# Patient Record
Sex: Male | Born: 1980 | Race: Black or African American | Hispanic: No | Marital: Single | Smoking: Former smoker
Health system: Southern US, Community
[De-identification: ages and names within clinical notes are randomized; demographics above are authoritative.]

## PROBLEM LIST (undated history)

## (undated) ENCOUNTER — Ambulatory Visit (HOSPITAL_COMMUNITY): Admission: EM | Payer: MEDICAID | Source: Home / Self Care

## (undated) ENCOUNTER — Ambulatory Visit (HOSPITAL_COMMUNITY): Disposition: A | Discharge status: Home / Self Care | Payer: BLUE CROSS/BLUE SHIELD

## (undated) DIAGNOSIS — K219 Gastro-esophageal reflux disease without esophagitis: Secondary | ICD-10-CM

## (undated) DIAGNOSIS — E119 Type 2 diabetes mellitus without complications: Secondary | ICD-10-CM

## (undated) HISTORY — PX: ANKLE ARTHROSCOPY WITH OPEN REDUCTION INTERNAL FIXATION (ORIF): SHX5582

## (undated) HISTORY — DX: Type 2 diabetes mellitus without complications: E11.9

---

## 2001-06-03 ENCOUNTER — Inpatient Hospital Stay (HOSPITAL_COMMUNITY): Admission: EM | Admit: 2001-06-03 | Discharge: 2001-06-07 | Payer: Self-pay | Admitting: *Deleted

## 2004-11-04 ENCOUNTER — Emergency Department (HOSPITAL_COMMUNITY): Admission: EM | Admit: 2004-11-04 | Discharge: 2004-11-04 | Payer: Self-pay | Admitting: Family Medicine

## 2005-01-20 ENCOUNTER — Emergency Department (HOSPITAL_COMMUNITY): Admission: EM | Admit: 2005-01-20 | Discharge: 2005-01-20 | Payer: Self-pay | Admitting: Family Medicine

## 2005-02-01 ENCOUNTER — Emergency Department (HOSPITAL_COMMUNITY): Admission: EM | Admit: 2005-02-01 | Discharge: 2005-02-01 | Payer: Self-pay | Admitting: Emergency Medicine

## 2005-03-23 ENCOUNTER — Emergency Department (HOSPITAL_COMMUNITY): Admission: EM | Admit: 2005-03-23 | Discharge: 2005-03-23 | Payer: Self-pay | Admitting: Family Medicine

## 2010-01-18 ENCOUNTER — Emergency Department (HOSPITAL_COMMUNITY): Admission: EM | Admit: 2010-01-18 | Discharge: 2010-01-18 | Payer: Self-pay | Admitting: Family Medicine

## 2011-01-26 LAB — POCT I-STAT, CHEM 8
Hemoglobin: 17.7 g/dL — ABNORMAL HIGH (ref 13.0–17.0)
Sodium: 138 mEq/L (ref 135–145)
TCO2: 27 mmol/L (ref 0–100)

## 2017-06-22 ENCOUNTER — Encounter: Payer: Self-pay | Admitting: Pediatric Intensive Care

## 2017-07-13 NOTE — Congregational Nurse Program (Signed)
Congregational Nurse Program Note  Date of Encounter: 06/22/2017  Past Medical History: No past medical history on file.  Encounter Details:     CNP Questionnaire - 06/22/17 1121      Patient Demographics   Is this a new or existing patient? New   Patient is considered a/an Not Applicable   Race African-American/Black     Patient Assistance   Location of Patient Assistance Not Applicable   Patient's financial/insurance status Low Income;Self-Pay (Uninsured)   Uninsured Patient ([employer], trade unionCard/Care Connects) No   Patient referred to apply for the following financial assistance Not Applicable   Food insecurities addressed Not Applicable   Transportation assistance Yes   Type of Assistance Bus Pass Given   Assistance securing medications No   Educational health offerings Acute disease;Navigating the healthcare system     Encounter Details   Primary purpose of visit Acute Illness/Condition Visit;Navigating the Healthcare System   Was an Emergency Department visit averted? Not Applicable   Does patient have a medical provider? No   Patient referred to Clinic   Was a mental health screening completed? (GAINS tool) No   Does patient have dental issues? No   Does patient have vision issues? No   Does your patient have an abnormal blood pressure today? No   Since previous encounter, have you referred patient for abnormal blood pressure that resulted in a new diagnosis or medication change? No   Does your patient have an abnormal blood glucose today? No   Since previous encounter, have you referred patient for abnormal blood glucose that resulted in a new diagnosis or medication change? No   Was there a life-saving intervention made? No      Recently had surgery on his right ankle in July.  Not wearing a brace or a boot.  Asked what "would happen if I did not wear the boot?"  Discussed with the healing process and the importance for him to wear the boot.  Does not have a health care  provider.  [facility]  Bus passes given

## 2017-07-13 NOTE — Congregational Nurse Program (Signed)
Congregational Nurse Program Note  Date of Encounter: 06/29/2017  Past Medical History: No past medical history on file.  Encounter Details:     CNP Questionnaire - 06/29/17 1125      Patient Demographics   Is this a new or existing patient? Existing   Patient is considered a/an Not Applicable   Race African-American/Black     Patient Assistance   Location of Patient Assistance Not Applicable   Patient's financial/insurance status Low Income;Self-Pay (Uninsured)   Uninsured Patient ([employer], trade unionCard/Care Connects) No   Patient referred to apply for the following financial assistance Not Applicable   Food insecurities addressed Not Applicable   Transportation assistance No   Assistance securing medications No   Educational health offerings Acute disease;Navigating the healthcare system     Encounter Details   Primary purpose of visit Acute Illness/Condition Visit;Navigating the Healthcare System   Was an Emergency Department visit averted? Not Applicable   Does patient have a medical provider? No   Patient referred to Clinic   Was a mental health screening completed? (GAINS tool) No   Does patient have dental issues? No   Does patient have vision issues? No   Does your patient have an abnormal blood pressure today? No   Since previous encounter, have you referred patient for abnormal blood pressure that resulted in a new diagnosis or medication change? No   Does your patient have an abnormal blood glucose today? No   Since previous encounter, have you referred patient for abnormal blood glucose that resulted in a new diagnosis or medication change? No   Was there a life-saving intervention made? No     Client still not wearing the boot for his right ankle recovery.  He asked the question again, "What will happen if I do not wear the boot"?  Explained to him again the risk involved in following through with the prescribed treatment plan and the importance of wearing the boot and  not bearing weight on that foot.  Client [facility].  Encouraged client to do so

## 2017-07-19 NOTE — Congregational Nurse Program (Signed)
Congregational Nurse Program Note  Date of Encounter: 06/22/2017  Past Medical History: No past medical history on file.  Encounter Details:     CNP Questionnaire - 06/29/17 1125      Patient Demographics   Is this a new or existing patient? Existing   Patient is considered a/an Not Applicable   Race African-American/Black     Patient Assistance   Location of Patient Assistance Not Applicable   Patient's financial/insurance status Low Income;Self-Pay (Uninsured)   Uninsured Patient ([employer], trade union) No   Patient referred to apply for the following financial assistance Not Applicable   Food insecurities addressed Not Applicable   Transportation assistance No   Assistance securing medications No   Educational health offerings Acute disease;Navigating the healthcare system     Encounter Details   Primary purpose of visit Acute Illness/Condition Visit;Navigating the Healthcare System   Was an Emergency Department visit averted? Not Applicable   Does patient have a medical provider? No   Patient referred to Clinic   Was a mental health screening completed? (GAINS tool) No   Does patient have dental issues? No   Does patient have vision issues? No   Does your patient have an abnormal blood pressure today? No   Since previous encounter, have you referred patient for abnormal blood pressure that resulted in a new diagnosis or medication change? No   Does your patient have an abnormal blood glucose today? No   Since previous encounter, have you referred patient for abnormal blood glucose that resulted in a new diagnosis or medication change? No   Was there a life-saving intervention made? No     Client fractured left ankle and had surgical repair at [facility]. Client is supposed to be non-weight bearing (per discharge instructions) and wearing a boot but client is weight bearing and not wearing boot. CN advised client that this will lengthen healing process, possible cause  increased pain. CN advised follow up at [facility].

## 2017-10-24 ENCOUNTER — Ambulatory Visit (HOSPITAL_COMMUNITY)
Admission: EM | Admit: 2017-10-24 | Discharge: 2017-10-24 | Disposition: A | Discharge status: Home / Self Care | Payer: Self-pay | Attending: Family Medicine | Admitting: Family Medicine

## 2017-10-24 ENCOUNTER — Encounter (HOSPITAL_COMMUNITY): Payer: Self-pay | Admitting: *Deleted

## 2017-10-24 ENCOUNTER — Other Ambulatory Visit: Payer: Self-pay

## 2017-10-24 DIAGNOSIS — J22 Unspecified acute lower respiratory infection: Secondary | ICD-10-CM

## 2017-10-24 MED ORDER — AZITHROMYCIN 250 MG PO TABS: ORAL_TABLET | ORAL | 0 refills | Status: DC

## 2017-10-24 NOTE — Discharge Instructions (Signed)
Push fluids to ensure adequate hydration and keep secretions thin.  Continue with robitussin/mucinex for congestion. Tylenol and/or ibuprofen as needed for pain or fevers.  Complete course of antibiotics. If symptoms worsen or do not improve in the next week to return to be seen or to follow up with PCP.

## 2017-10-24 NOTE — ED Triage Notes (Signed)
C/O cough x 3 months with body aches.  C/O fatigue x 1 month.  Unk if fevers.  Has taken several OTC meds for sxs without any relief.

## 2017-10-24 NOTE — ED Provider Notes (Signed)
[facility]    CSN: [id number] Arrival date & time: 10/24/17  1754     History   Chief Complaint Chief Complaint  Patient presents with  . Cough  . Generalized Body Aches    HPI Jeffery Santos is a 36 y.o. male.   Jeffery Santos presents with complaints of cough and congestion which has been ongoing for the past three months but has worsened over the past few days. Mucus comes up with cough causing gagging. Runny nose worse today. Without ear pain or known fevers. Denies abdominal pain, nausea or vomiting. Without recent travel out of the county. Without asthma history. Does smoke approximately 0.5ppd. Has been taking nyquil, robitussin and mucinex for his symptoms which minimally help. Abdomen muscles are sore from coughing and now throat is painful due to cough. No specific known ill contacts. Fatigued.   ROS per HPI.       History reviewed. No pertinent past medical history.  There are no active problems to display for this patient.   Past Surgical History:  Procedure Laterality Date  . ANKLE ARTHROSCOPY WITH OPEN REDUCTION INTERNAL FIXATION (ORIF)         Home Medications    Prior to Admission medications   Medication Sig Start Date End Date Taking? Authorizing Provider  azithromycin (ZITHROMAX) 250 MG tablet Take first 2 tablets together, then 1 every day until finished. 10/24/17   Georgetta HaberBurky, Natalie B, NP    Family History No family history on file.  Social History Social History   Tobacco Use  . Smoking status: Current Every Day Smoker    Types: Cigarettes  Substance Use Topics  . Alcohol use: Yes    Comment: occasionally  . Drug use: No     Allergies   Patient has no known allergies.   Review of Systems Review of Systems   Physical Exam Triage Vital Signs ED Triage Vitals  Enc Vitals Group     BP 10/24/17 1802 136/84     Pulse Rate 10/24/17 1802 93     Resp 10/24/17 1802 (!) 22     Temp 10/24/17 1802 99 F (37.2 C)     Temp  Source 10/24/17 1802 Oral     SpO2 10/24/17 1802 97 %     Weight --      Height --      Head Circumference --      Peak Flow --      Pain Score 10/24/17 1804 2     Pain Loc --      Pain Edu? --      Excl. in GC? --    No data found.  Updated Vital Signs BP 136/84   Pulse 93   Temp 99 F (37.2 C) (Oral)   Resp (!) 22   SpO2 97%   Visual Acuity Right Eye Distance:   Left Eye Distance:   Bilateral Distance:    Right Eye Near:   Left Eye Near:    Bilateral Near:     Physical Exam  Constitutional: He is oriented to person, place, and time. He appears well-developed and well-nourished.  HENT:  Head: Normocephalic and atraumatic.  Right Ear: Tympanic membrane, external ear and ear canal normal.  Left Ear: Tympanic membrane, external ear and ear canal normal.  Nose: Rhinorrhea present. Right sinus exhibits no maxillary sinus tenderness and no frontal sinus tenderness. Left sinus exhibits no maxillary sinus tenderness and no frontal sinus tenderness.  Mouth/Throat: Uvula is midline, oropharynx  is clear and moist and mucous membranes are normal.  Eyes: Conjunctivae are normal. Pupils are equal, round, and reactive to light.  Neck: Normal range of motion.  Cardiovascular: Normal rate and regular rhythm.  Pulmonary/Chest: Effort normal and breath sounds normal.  Significant frequent congested cough noted  Lymphadenopathy:    He has no cervical adenopathy.  Neurological: He is alert and oriented to person, place, and time.  Skin: Skin is warm and dry.  Vitals reviewed.    UC Treatments / Results  Labs (all labs ordered are listed, but only abnormal results are displayed) Labs Reviewed - No data to display  EKG  EKG Interpretation None       Radiology No results found.  Procedures Procedures (including critical care time)  Medications Ordered in UC Medications - No data to display   Initial Impression / Assessment and Plan / UC Course  I have reviewed the  triage vital signs and the nursing notes.  Pertinent labs & imaging results that were available during my care of the patient were reviewed by me and considered in my medical decision making (see chart for details).     Complete course of antibiotics. Without tachycardia or hypoxia, temp 99 today, deferred xray at this time. Obvious significant mucus production present. Without significant risk factors of TB, discussed return or follow up with PCP if symptoms do not improve with antibiotics or if worsening of symptoms. Continue with OTC medications as needed for symptoms. Patient verbalized understanding and agreeable to plan.    Final Clinical Impressions(s) / UC Diagnoses   Final diagnoses:  Lower respiratory tract infection    ED Discharge Orders        Ordered    azithromycin (ZITHROMAX) 250 MG tablet     10/24/17 1817       Controlled Substance Prescriptions [place] Controlled Substance Registry consulted? Not Applicable   Georgetta HaberBurky, Natalie B, NP 10/24/17 [phone]

## 2017-11-23 ENCOUNTER — Emergency Department (HOSPITAL_COMMUNITY)
Admission: EM | Admit: 2017-11-23 | Discharge: 2017-11-24 | Disposition: A | Discharge status: Home / Self Care | Payer: Self-pay | Attending: Emergency Medicine | Admitting: Emergency Medicine

## 2017-11-23 ENCOUNTER — Other Ambulatory Visit: Payer: Self-pay

## 2017-11-23 ENCOUNTER — Encounter (HOSPITAL_COMMUNITY): Payer: Self-pay | Admitting: Emergency Medicine

## 2017-11-23 DIAGNOSIS — M25572 Pain in left ankle and joints of left foot: Secondary | ICD-10-CM | POA: Insufficient documentation

## 2017-11-23 DIAGNOSIS — G8929 Other chronic pain: Secondary | ICD-10-CM | POA: Insufficient documentation

## 2017-11-23 DIAGNOSIS — F17210 Nicotine dependence, cigarettes, uncomplicated: Secondary | ICD-10-CM | POA: Insufficient documentation

## 2017-11-23 NOTE — ED Notes (Signed)
ED Provider at bedside. 

## 2017-11-23 NOTE — ED Triage Notes (Addendum)
Patient reports worsening chronic left ankle pain for several weeks , denies recent injury or fall , pt. stated he injured it last year ( October) [facility] Ambulatory with no swelling or deformity .

## 2017-11-23 NOTE — ED Provider Notes (Signed)
[facility] Provider Note   CSN: [id number] Arrival date & time: 11/23/17  1854     History   Chief Complaint Chief Complaint  Patient presents with  . Ankle Pain    Chronic     HPI Jeffery Santos is a 37 y.o. male.  HPI   Jeffery Santos is a 37 year old male with a history of ORIF of left ankle 08/2017 who presents emergency department for persistent left ankle pain over the past month and a half.  Patient states that he broke his left ankle while in prison and [facility].  Reports that he was improving with physical therapy and pain medication after the surgery, but when he was released from prison states that he is no longer received care.  States that he is frustrated because for the last month and a half he has had persistent left ankle pain.  Reports that the pain is generalized over the entire foot, no specific point tenderness.  Denies recent injury to the foot.  Reports 10/10 severity sharp ankle pain.  He has tried taking ibuprofen without significant relief of his symptoms.  Pain is worsened when he ambulates or moves the ankle in any way.  Patient denies fever, chills, redness, swelling, numbness, weakness.  Is able to ambulate independently.  History reviewed. No pertinent past medical history.  There are no active problems to display for this patient.   Past Surgical History:  Procedure Laterality Date  . ANKLE ARTHROSCOPY WITH OPEN REDUCTION INTERNAL FIXATION (ORIF)         Home Medications    Prior to Admission medications   Medication Sig Start Date End Date Taking? Authorizing Provider  azithromycin (ZITHROMAX) 250 MG tablet Take first 2 tablets together, then 1 every day until finished. 10/24/17   Georgetta HaberBurky, Natalie B, NP    Family History No family history on file.  Social History Social History   Tobacco Use  . Smoking status: Current Every Day Smoker    Types: Cigarettes    Substance Use Topics  . Alcohol use: Yes    Comment: occasionally  . Drug use: No     Allergies   Patient has no known allergies.   Review of Systems Review of Systems  Constitutional: Negative for chills, fatigue and fever.  Musculoskeletal: Positive for arthralgias (left ankle). Negative for gait problem and joint swelling.  Skin: Negative for color change and wound.  Neurological: Negative for weakness and numbness.     Physical Exam Updated Vital Signs BP 119/82 (BP Location: Right Arm)   Pulse 99   Temp 99.8 F (37.7 C) (Oral)   Resp 20   SpO2 99%   Physical Exam  Constitutional: He is oriented to person, place, and time. He appears well-developed and well-nourished. No distress.  HENT:  Head: Normocephalic and atraumatic.  Eyes: Right eye exhibits no discharge. Left eye exhibits no discharge.  Pulmonary/Chest: Effort normal. No respiratory distress.  Musculoskeletal:  Left ankle with tenderness to palpation grossly over lateral and medial malleolus and dorsum of the foot. No swelling.  Full active ROM, although painful. No erythema, ecchymosis, or deformity appreciated. No break in skin. No pain to fifth metatarsal area or navicular region. Achilles intact. Good pedal pulse and cap refill of toes.   Neurological: He is alert and oriented to person, place, and time. Coordination normal.  Distal sensation to light/sharp touch intact in bilateral lower extremities.  Gait normal  and coordination and balance, although painful.  Skin: Skin is warm and dry. Capillary refill takes less than 2 seconds. No rash noted. He is not diaphoretic. No erythema.  Psychiatric: He has a normal mood and affect. His behavior is normal.  Nursing note and vitals reviewed.    ED Treatments / Results  Labs (all labs ordered are listed, but only abnormal results are displayed) Labs Reviewed - No data to display  EKG  EKG Interpretation None       Radiology No results  found.  Procedures Procedures (including critical care time)  Medications Ordered in ED Medications - No data to display   Initial Impression / Assessment and Plan / ED Course  I have reviewed the triage vital signs and the nursing notes.  Pertinent labs & imaging results that were available during my care of the patient were reviewed by me and considered in my medical decision making (see chart for details).    Left foot neurovascularly intact. No erythema, edema or warmth to suggest infection. He has full active ROM. Presentation non-concerning for septic arthritis. He is able to ambulate independently. Patient refuses an Xray to evaluate for acute fracture or abnormality. Discussed with patient that we do not manage chronic pain from the Emergency Department. Have counseled him to follow up with his orthopedist for further evaluation and pain management concerns. Patient also given information to follow up with pain management. He agrees and voices understanding to the above plan.   Final Clinical Impressions(s) / ED Diagnoses   Final diagnoses:  Chronic pain of left ankle    ED Discharge Orders    None       Lawrence Marseilles 11/24/17 1027    Bethann Berkshire, MD 11/24/17 1620

## 2017-11-23 NOTE — ED Notes (Signed)
Pts name called for a room no answer 

## 2017-11-24 NOTE — Discharge Instructions (Signed)
We do not manage chronic pain from the emergency department.  Please take ibuprofen and Tylenol as needed.  I have listed the information below to pain management.  Please call to schedule an appointment to manage her ongoing pain.  Return to the emergency department if you have any new or worsening symptoms.

## 2018-02-04 ENCOUNTER — Ambulatory Visit (HOSPITAL_COMMUNITY): Admission: EM | Admit: 2018-02-04 | Discharge: 2018-02-04 | Discharge status: Against Medical Advice | Payer: Self-pay

## 2018-02-04 NOTE — ED Notes (Signed)
Per pt access, pt Left before registering.

## 2018-12-07 ENCOUNTER — Other Ambulatory Visit: Payer: Self-pay

## 2018-12-07 ENCOUNTER — Encounter (HOSPITAL_COMMUNITY): Payer: Self-pay | Admitting: Emergency Medicine

## 2018-12-07 ENCOUNTER — Ambulatory Visit (HOSPITAL_COMMUNITY)
Admission: EM | Admit: 2018-12-07 | Discharge: 2018-12-07 | Disposition: A | Discharge status: Home / Self Care | Payer: Self-pay | Attending: Family Medicine | Admitting: Family Medicine

## 2018-12-07 DIAGNOSIS — N3001 Acute cystitis with hematuria: Secondary | ICD-10-CM

## 2018-12-07 LAB — POCT URINALYSIS DIP (DEVICE)
BILIRUBIN URINE: NEGATIVE
Glucose, UA: NEGATIVE mg/dL
Ketones, ur: NEGATIVE mg/dL
NITRITE: NEGATIVE
Protein, ur: NEGATIVE mg/dL
Specific Gravity, Urine: >=1.03 (ref 1.005–1.030)
Urobilinogen, UA: 0.2 mg/dL (ref 0.0–1.0)
pH: 5.5 (ref 5.0–8.0)

## 2018-12-07 MED ORDER — CEPHALEXIN 500 MG PO CAPS: 500.0000 mg | ORAL_CAPSULE | Freq: Three times a day (TID) | ORAL | 0 refills | Status: DC

## 2018-12-07 NOTE — ED Provider Notes (Signed)
[facility]    CSN: [id number] Arrival date & time: 12/07/18  [id number]     History   Chief Complaint Chief Complaint  Patient presents with  . SEXUALLY TRANSMITTED DISEASE    HPI Arna SnipeByron L Ruvalcaba is a 38 y.o. male.   HPI  Patient is here for a possible urinary tract infection.  He does not think he has been exposed to any STD, he is in a long standing relationship.  He has never had a bladder, prostate, or kidney infection.  He has no abdominal pain.  He has no fever chills.  He does have dysuria.  No discharge.  No rash.  He states he also has some pain with an erection at the base of his penis.  No problems with bowels.  Symptoms have been present for 2 or 3 days. He has a lot of questions about how men get urinary tract infections.  History reviewed. No pertinent past medical history.  There are no active problems to display for this patient.   Past Surgical History:  Procedure Laterality Date  . ANKLE ARTHROSCOPY WITH OPEN REDUCTION INTERNAL FIXATION (ORIF)         Home Medications    Prior to Admission medications   Medication Sig Start Date End Date Taking? Authorizing Provider  cephALEXin (KEFLEX) 500 MG capsule Take 1 capsule (500 mg total) by mouth 3 (three) times daily. 12/07/18   Eustace MooreNelson, Stellan Vick Sue, MD    Family History Family History  Problem Relation Age of Onset  . Healthy Mother   . Healthy Father     Social History Social History   Tobacco Use  . Smoking status: Current Every Day Smoker    Types: Cigarettes  Substance Use Topics  . Alcohol use: Yes    Comment: occasionally  . Drug use: No     Allergies   Patient has no known allergies.   Review of Systems Review of Systems  Constitutional: Negative for chills and fever.  HENT: Negative for ear pain and sore throat.   Eyes: Negative for pain and visual disturbance.  Respiratory: Negative for cough and shortness of breath.   Cardiovascular: Negative for chest pain and  palpitations.  Gastrointestinal: Negative for abdominal pain and vomiting.  Genitourinary: Positive for dysuria and penile pain. Negative for hematuria.  Musculoskeletal: Negative for arthralgias and back pain.  Skin: Negative for color change and rash.  Neurological: Negative for seizures and syncope.  All other systems reviewed and are negative.    Physical Exam Triage Vital Signs ED Triage Vitals [12/07/18 0956]  Enc Vitals Group     BP      Pulse      Resp      Temp      Temp src      SpO2      Weight      Height      Head Circumference      Peak Flow      Pain Score 3     Pain Loc      Pain Edu?      Excl. in GC?    No data found.  Updated Vital Signs There were no vitals taken for this visit.  Visual Acuity Right Eye Distance:   Left Eye Distance:   Bilateral Distance:    Right Eye Near:   Left Eye Near:    Bilateral Near:     Physical Exam Constitutional:      General: He  is not in acute distress.    Appearance: He is well-developed.  HENT:     Head: Normocephalic and atraumatic.  Eyes:     Conjunctiva/sclera: Conjunctivae normal.     Pupils: Pupils are equal, round, and reactive to light.  Neck:     Musculoskeletal: Normal range of motion.  Cardiovascular:     Rate and Rhythm: Normal rate.  Pulmonary:     Effort: Pulmonary effort is normal. No respiratory distress.  Abdominal:     General: There is no distension.     Palpations: Abdomen is soft.  Genitourinary:    Comments: No current symptoms Musculoskeletal: Normal range of motion.  Skin:    General: Skin is warm and dry.  Neurological:     Mental Status: He is alert.      UC Treatments / Results  Labs (all labs ordered are listed, but only abnormal results are displayed) Labs Reviewed  POCT URINALYSIS DIP (DEVICE) - Abnormal; Notable for the following components:      Result Value   Hgb urine dipstick TRACE (*)    Leukocytes, UA SMALL (*)    All other components within normal  limits  RPR  HIV ANTIBODY (ROUTINE TESTING W REFLEX)  URINE CYTOLOGY ANCILLARY ONLY    EKG None  Radiology No results found.  Procedures Procedures (including critical care time)  Medications Ordered in UC Medications - No data to display  Initial Impression / Assessment and Plan / UC Course  I have reviewed the triage vital signs and the nursing notes.  Pertinent labs & imaging results that were available during my care of the patient were reviewed by me and considered in my medical decision making (see chart for details).     Discussed the basics of the male urinary tract, kidneys, ureters, bladder, urethra, and prostate.  Infection can be present in any of these structures.  Infection can be from a bacterial cause, STD or a common bacteria.  No indication at this time of infidelity or STD. Final Clinical Impressions(s) / UC Diagnoses   Final diagnoses:  Acute cystitis with hematuria     Discharge Instructions     Take the antibiotic as prescribed Drink more water We did lab testing during this visit.  If there are any abnormal findings that require change in medicine or indicate a positive result, you will be notified.  If all of your tests are normal, you will not be called.      ED Prescriptions    Medication Sig Dispense Auth. Provider   cephALEXin (KEFLEX) 500 MG capsule Take 1 capsule (500 mg total) by mouth 3 (three) times daily. 15 capsule Eustace Moore, MD     Controlled Substance Prescriptions [place] Controlled Substance Registry consulted? Not Applicable   Eustace Moore, MD 12/07/18 1345

## 2018-12-07 NOTE — ED Triage Notes (Signed)
Soreness with erect penis, noticed over 2-3 days.   No pain with urination.  Slight uncomfortable feeling with urination No penile discharge

## 2018-12-07 NOTE — Discharge Instructions (Addendum)
Take the antibiotic as prescribed Drink more water We did lab testing during this visit.  If there are any abnormal findings that require change in medicine or indicate a positive result, you will be notified.  If all of your tests are normal, you will not be called.

## 2018-12-08 LAB — URINE CYTOLOGY ANCILLARY ONLY
Chlamydia: POSITIVE — AB
NEISSERIA GONORRHEA: NEGATIVE
TRICH (WINDOWPATH): NEGATIVE

## 2018-12-08 LAB — HIV ANTIBODY (ROUTINE TESTING W REFLEX): HIV Screen 4th Generation wRfx: NONREACTIVE

## 2018-12-08 LAB — RPR: RPR Ser Ql: NONREACTIVE

## 2018-12-09 ENCOUNTER — Telehealth (HOSPITAL_COMMUNITY): Payer: Self-pay | Admitting: Emergency Medicine

## 2018-12-09 MED ORDER — AZITHROMYCIN 250 MG PO TABS: 1000.0000 mg | ORAL_TABLET | Freq: Once | ORAL | 0 refills | Status: AC

## 2018-12-09 NOTE — Telephone Encounter (Signed)
Chlamydia is positive.  Rx po zithromax 1g #1 dose no refills was sent to the pharmacy of record.  Pt needs education to please refrain from sexual intercourse for 7 days to give the medicine time to work, sexual partners need to be notified and tested/treated.  Condoms may reduce risk of reinfection.  Recheck or followup with PCP for further evaluation if symptoms are not improving.   [facility] notified.  Number not in service. No other number. Letter sent.

## 2019-01-06 ENCOUNTER — Telehealth (HOSPITAL_COMMUNITY): Payer: Self-pay | Admitting: Internal Medicine

## 2019-01-06 MED ORDER — AZITHROMYCIN 250 MG PO TABS: 1000.0000 mg | ORAL_TABLET | Freq: Every day | ORAL | 0 refills | Status: AC

## 2019-01-06 NOTE — Telephone Encounter (Signed)
PT made aware of positive chlamydia result. He requests azithromycin be sent to [facility]. PT given instructions and instructed to refrain from sexual intercourse for 7 days.

## 2019-05-10 ENCOUNTER — Encounter (HOSPITAL_COMMUNITY): Payer: Self-pay

## 2019-05-10 ENCOUNTER — Other Ambulatory Visit: Payer: Self-pay

## 2019-05-10 ENCOUNTER — Ambulatory Visit (HOSPITAL_COMMUNITY)
Admission: EM | Admit: 2019-05-10 | Discharge: 2019-05-10 | Disposition: A | Discharge status: Home / Self Care | Payer: Self-pay | Attending: Emergency Medicine | Admitting: Emergency Medicine

## 2019-05-10 ENCOUNTER — Ambulatory Visit (INDEPENDENT_AMBULATORY_CARE_PROVIDER_SITE_OTHER): Payer: Self-pay

## 2019-05-10 DIAGNOSIS — M25572 Pain in left ankle and joints of left foot: Secondary | ICD-10-CM

## 2019-05-10 DIAGNOSIS — G8929 Other chronic pain: Secondary | ICD-10-CM

## 2019-05-10 MED ORDER — NAPROXEN 500 MG PO TABS: 500.0000 mg | ORAL_TABLET | Freq: Two times a day (BID) | ORAL | 0 refills | Status: DC

## 2019-05-10 MED ORDER — HYDROCODONE-ACETAMINOPHEN 5-325 MG PO TABS: 1.0000 | ORAL_TABLET | Freq: Four times a day (QID) | ORAL | 0 refills | Status: DC | PRN

## 2019-05-10 NOTE — ED Provider Notes (Signed)
HPI  SUBJECTIVE:  Jeffery Santos is a 38 y.o. male who presents with left anterior lateral ankle pain described as throbbing, sharp since injuring it and having surgery on it 22 months ago.  He states that there are some days where he is pain-free.  States that the pain has not changed since it started but that it is severe.  No fever, erythema, swelling.  Reports some numbness in between his first and second toe, but this has been present since the surgery.  No limitation of motion.  No increased temperature.  No repeat injury, change in his physical activity.  He has tried Tylenol, ibuprofen, ice, elevation without improvement of symptoms.  No alleviating factors.  Symptoms are worse with weightbearing.  He has a past medical history of left ankle injury status post surgery at [facility].  History reviewed. No pertinent past medical history.  Past Surgical History:  Procedure Laterality Date  . ANKLE ARTHROSCOPY WITH OPEN REDUCTION INTERNAL FIXATION (ORIF)      Family History  Problem Relation Age of Onset  . Healthy Mother   . Healthy Father     Social History   Tobacco Use  . Smoking status: Current Every Day Smoker    Types: Cigarettes  Substance Use Topics  . Alcohol use: Yes    Comment: occasionally  . Drug use: No    No current facility-administered medications for this encounter.   Current Outpatient Medications:  .  HYDROcodone-acetaminophen (NORCO/VICODIN) 5-325 MG tablet, Take 1-2 tablets by mouth every 6 (six) hours as needed for moderate pain or severe pain., Disp: 12 tablet, Rfl: 0 .  naproxen (NAPROSYN) 500 MG tablet, Take 1 tablet (500 mg total) by mouth 2 (two) times daily., Disp: 20 tablet, Rfl: 0  No Known Allergies   ROS  As noted in HPI.   Physical Exam  BP (!) 134/91 (BP Location: Right Arm)   Pulse 86   Temp 98.6 F (37 C) (Oral)   Resp 18   Wt 122.5 kg   SpO2 98%   Constitutional: Well developed, well nourished, no acute distress Eyes:  EOMI,  conjunctiva normal bilaterally HENT: Normocephalic, atraumatic,mucus membranes moist Respiratory: Normal inspiratory effort Cardiovascular: Normal rate GI: nondistended skin: No rash, skin intact Musculoskeletal:  + 2 surgical scars anterior ankle, lateral scar is tender.  L Ankle Tenderness,  Distal fibula  tender, Medial malleolus NT ,  Deltoid ligament medially NT , ATFL laterally  tender, posterior tablofibular ligament NT, calcaneofibular ligament laterally NT,  Achilles NT, calcaneus NT,  Proximal 5th metatarsal NT, Midfoot NT, distal NVI with baseline sensation / motor to foot grossly intact.  Pain with dorsiflexion/plantarflexion.  no pain with inversion/eversion.  - bruising.  No erythema, swelling.  No increased temperature.  Neurologic: Alert & oriented x 3, no focal neuro deficits Psychiatric: Speech and behavior appropriate   ED Course   Medications - No data to display  Orders Placed This Encounter  Procedures  . DG Ankle Complete Left    Standing Status:   Standing    Number of Occurrences:   1    Order Specific Question:   Reason for Exam (SYMPTOM  OR DIAGNOSIS REQUIRED)    Answer:   s/p orif r/o acute changes    No results found for this or any previous visit (from the past 24 hour(s)). Dg Ankle Complete Left  Result Date: 05/10/2019 CLINICAL DATA:  38 year old male with 4 months of left ankle pain. Swelling. Severe pain with weight-bearing.  Prior surgery. EXAM: LEFT ANKLE COMPLETE - 3+ VIEW COMPARISON:  None. FINDINGS: Degenerative changes throughout the left ankle including chronic appearing degenerative spurring and 1 or more small chronic appearing lateral malleolus ossific fragments. There is mild heterogeneity at the medial aspect of the talar dome. Possible small joint effusion. No fracture or dislocation. There is also mild calcaneus degenerative spurring. IMPRESSION: Advanced posttraumatic and/or degenerative changes at the left ankle. Small osteochondral defect  suspected at the medial talar dome. Possible small joint effusion. No acute osseous abnormality identified. Electronically Signed   By: Odessa FlemingH  Hall M.D.   On: 05/10/2019 11:09    ED Clinical Impression  1. Chronic pain of left ankle      ED Assessment/Plan  [place] Narcotic database reviewed for this patient, and feel that the risk/benefit ratio today is favorable for proceeding with a prescription for controlled substance.  No opiate prescriptions since 2019.  Will x-ray left ankle to rule out any acute changes.  No evidence of septic joint, gout.  If normal, will have him start wearing his ankle brace again, patient declined one here, will do high-dose Naprosyn combined with Tylenol twice a day for mild to moderate pain, Naprosyn/Norco for severe pain.  Advised him that he needs to follow-up with his surgeon who did the surgery, but we will also provide him a referral to local orthopedic practices.  Advised him that he will need to see orthopedics for ongoing management.  Reviewed imaging independently.  Advanced posttraumatic/degenerative changes, small osteochondral defect medial talar dome.  Possible small joint effusion.  No acute changes identified.  See radiology report for full details.  Discussed imaging, MDM, treatment plan, and plan for follow-up with patient. patient agrees with plan.   Meds ordered this encounter  Medications  . naproxen (NAPROSYN) 500 MG tablet    Sig: Take 1 tablet (500 mg total) by mouth 2 (two) times daily.    Dispense:  20 tablet    Refill:  0  . HYDROcodone-acetaminophen (NORCO/VICODIN) 5-325 MG tablet    Sig: Take 1-2 tablets by mouth every 6 (six) hours as needed for moderate pain or severe pain.    Dispense:  12 tablet    Refill:  0    *This clinic note was created using Scientist, clinical (histocompatibility and immunogenetics)Dragon dictation software. Therefore, there may be occasional mistakes despite careful proofreading.   ?   Domenick GongMortenson, Laylamarie Meuser, MD 05/10/19 1130

## 2019-05-10 NOTE — Discharge Instructions (Addendum)
Your x-ray showed that you had degenerative changes, but no acute bone problems.  Try wearing the ankle brace again.  Take ibuprofen with a Tylenol containing product 3 or 4 times a day as needed for pain.  You may take the ibuprofen with 1 g of Tylenol 3 or 4 times a day, or take the ibuprofen with hydrocodone for severe pain.  Do not take Tylenol and hydrocodone as they both have Tylenol in them and too much Tylenol can hurt your liver.  You need to be seen by an orthopedic surgeon as soon as you possibly can.  If you are unable to be seen by the surgeon he performed the procedure at [facility], you may follow-up with anyone of the orthopedic practices listed here.

## 2019-05-10 NOTE — ED Triage Notes (Signed)
Pt states he had a ankle injury 2 years ago. Pt states this a on going issue with his left ankle. Pt states that his pain is unbearable pain.

## 2019-06-02 ENCOUNTER — Ambulatory Visit (HOSPITAL_COMMUNITY)
Admission: EM | Admit: 2019-06-02 | Discharge: 2019-06-02 | Disposition: A | Discharge status: Home / Self Care | Payer: Self-pay | Attending: Family Medicine | Admitting: Family Medicine

## 2019-06-02 ENCOUNTER — Other Ambulatory Visit: Payer: Self-pay

## 2019-06-02 ENCOUNTER — Encounter (HOSPITAL_COMMUNITY): Payer: Self-pay

## 2019-06-02 DIAGNOSIS — R369 Urethral discharge, unspecified: Secondary | ICD-10-CM

## 2019-06-02 MED ORDER — AZITHROMYCIN 250 MG PO TABS
ORAL_TABLET | ORAL | Status: AC
  Filled 2019-06-02: qty 4

## 2019-06-02 MED ORDER — CEFTRIAXONE SODIUM 250 MG IJ SOLR
250.0000 mg | Freq: Once | INTRAMUSCULAR | Status: AC
  Administered 2019-06-02: 250 mg via INTRAMUSCULAR

## 2019-06-02 MED ORDER — AZITHROMYCIN 250 MG PO TABS
1000.0000 mg | ORAL_TABLET | Freq: Once | ORAL | Status: AC
  Administered 2019-06-02: 20:00:00 1000 mg via ORAL

## 2019-06-02 MED ORDER — CEFTRIAXONE SODIUM 250 MG IJ SOLR
INTRAMUSCULAR | Status: AC
  Filled 2019-06-02: qty 250

## 2019-06-02 NOTE — ED Triage Notes (Signed)
Pt presents with what he describes as a yellow discharge coming from his penis and burning sensation when he urinates since Monday.

## 2019-06-02 NOTE — Discharge Instructions (Addendum)
This is almost certainly gonorrhea.  Avoid intercourse for a week.  Tests should be available in 2 days.

## 2019-06-02 NOTE — ED Provider Notes (Signed)
[facility]    CSN: [id number] Arrival date & time: 06/02/19  1846     History   Chief Complaint Chief Complaint  Patient presents with  . Exposure to STD    HPI Jeffery Santos is a 38 y.o. male.   Pt presents with what he describes as a yellow discharge coming from his penis and burning sensation when he urinates since yesterday.  Had sex with new partner Tuesday.  Now copious yellow d/c and dysuria.  Established [place] patient     History reviewed. No pertinent past medical history.  There are no active problems to display for this patient.   Past Surgical History:  Procedure Laterality Date  . ANKLE ARTHROSCOPY WITH OPEN REDUCTION INTERNAL FIXATION (ORIF)         Home Medications    Prior to Admission medications   Medication Sig Start Date End Date Taking? Authorizing Provider  naproxen (NAPROSYN) 500 MG tablet Take 1 tablet (500 mg total) by mouth 2 (two) times daily. 05/10/19   Melynda Ripple, MD    Family History Family History  Problem Relation Age of Onset  . Healthy Mother   . Healthy Father     Social History Social History   Tobacco Use  . Smoking status: Current Every Day Smoker    Types: Cigarettes  Substance Use Topics  . Alcohol use: Yes    Comment: occasionally  . Drug use: No     Allergies   Vicodin [hydrocodone-acetaminophen]   Review of Systems Review of Systems  Genitourinary: Positive for discharge and dysuria.  All other systems reviewed and are negative.    Physical Exam Triage Vital Signs ED Triage Vitals [06/02/19 1913]  Enc Vitals Group     BP (!) 148/89     Pulse Rate 92     Resp 18     Temp 97.8 F (36.6 C)     Temp Source Oral     SpO2 98 %     Weight      Height      Head Circumference      Peak Flow      Pain Score 5     Pain Loc      Pain Edu?      Excl. in [place]?    No data found.  Updated Vital Signs BP (!) 148/89 (BP Location: Left Arm)   Pulse 92   Temp 97.8 F (36.6 C)  (Oral)   Resp 18   SpO2 98%    Physical Exam Vitals signs and nursing note reviewed.  Constitutional:      Appearance: Normal appearance.  Eyes:     Conjunctiva/sclera: Conjunctivae normal.  Neck:     Musculoskeletal: Normal range of motion and neck supple.  Pulmonary:     Effort: Pulmonary effort is normal.  Genitourinary:    Comments: Copious yellow penile d/c from circumcised male Musculoskeletal: Normal range of motion.  Skin:    General: Skin is warm and dry.  Neurological:     General: No focal deficit present.     Mental Status: He is alert.  Psychiatric:        Mood and Affect: Mood normal.      UC Treatments / Results  Labs (all labs ordered are listed, but only abnormal results are displayed) Labs Reviewed  [place]    EKG   Radiology No results found.  Procedures Procedures (including critical care time)  Medications Ordered in  UC Medications  azithromycin (ZITHROMAX) tablet 1,000 mg (has no administration in time range)  cefTRIAXone (ROCEPHIN) injection 250 mg (has no administration in time range)    Initial Impression / Assessment and Plan / UC Course  I have reviewed the triage vital signs and the nursing notes.  Pertinent labs & imaging results that were available during my care of the patient were reviewed by me and considered in my medical decision making (see chart for details).    Final Clinical Impressions(s) / UC Diagnoses   Final diagnoses:  Penile discharge     Discharge Instructions     This is almost certainly gonorrhea.  Avoid intercourse for a week.  Tests should be available in 2 days.    ED Prescriptions    None     Controlled Substance Prescriptions [place] Controlled Substance Registry consulted? Not Applicable   Elvina SidleLauenstein, Skii Cleland, MD 06/02/19 [phone]

## 2019-06-04 LAB — URINE CYTOLOGY ANCILLARY ONLY
Chlamydia: NEGATIVE
Neisseria Gonorrhea: POSITIVE — AB
Trichomonas: NEGATIVE

## 2019-06-06 ENCOUNTER — Telehealth (HOSPITAL_COMMUNITY): Payer: Self-pay | Admitting: Emergency Medicine

## 2019-06-06 NOTE — Telephone Encounter (Signed)
Test for gonorrhea was positive. This was treated at the urgent care visit with IM rocephin 250mg and po zithromax 1g. Pt needs education to refrain from sexual intercourse for 7 days after treatment to give the medicine time to work. Sexual partners need to be notified and tested/treated. Condoms may reduce risk of reinfection. Recheck or followup with PCP for further evaluation if symptoms are not improving. [facility] notified.   Patient contacted and made aware of    results, all questions answered   

## 2019-06-22 ENCOUNTER — Ambulatory Visit (HOSPITAL_COMMUNITY)
Admission: EM | Admit: 2019-06-22 | Discharge: 2019-06-22 | Disposition: A | Discharge status: Home / Self Care | Payer: Self-pay | Attending: Family Medicine | Admitting: Family Medicine

## 2019-06-22 ENCOUNTER — Encounter (HOSPITAL_COMMUNITY): Payer: Self-pay

## 2019-06-22 ENCOUNTER — Other Ambulatory Visit: Payer: Self-pay

## 2019-06-22 DIAGNOSIS — R369 Urethral discharge, unspecified: Secondary | ICD-10-CM | POA: Insufficient documentation

## 2019-06-22 LAB — POCT URINALYSIS DIP (DEVICE)
Bilirubin Urine: NEGATIVE
Glucose, UA: NEGATIVE mg/dL
Ketones, ur: NEGATIVE mg/dL
Leukocytes,Ua: NEGATIVE
Nitrite: NEGATIVE
Protein, ur: 30 mg/dL — AB
Specific Gravity, Urine: >=1.03 (ref 1.005–1.030)
Urobilinogen, UA: 0.2 mg/dL (ref 0.0–1.0)
pH: 6 (ref 5.0–8.0)

## 2019-06-22 MED ORDER — AZITHROMYCIN 250 MG PO TABS
ORAL_TABLET | ORAL | Status: AC
  Filled 2019-06-22: qty 4

## 2019-06-22 MED ORDER — AZITHROMYCIN 250 MG PO TABS
1000.0000 mg | ORAL_TABLET | Freq: Once | ORAL | Status: AC
  Administered 2019-06-22: 16:00:00 1000 mg via ORAL

## 2019-06-22 MED ORDER — CEFTRIAXONE SODIUM 250 MG IJ SOLR
INTRAMUSCULAR | Status: AC
  Filled 2019-06-22: qty 250

## 2019-06-22 MED ORDER — CEFTRIAXONE SODIUM 250 MG IJ SOLR
250.0000 mg | Freq: Once | INTRAMUSCULAR | Status: AC
  Administered 2019-06-22: 250 mg via INTRAMUSCULAR

## 2019-06-22 NOTE — ED Triage Notes (Signed)
Pt presents to UC with tingling feeling after urination and ejaculation. Also states has seen yellow pus coming from penis after waking up yesterday, which was the night before having sexual intercourse with his partner. Denies pain.

## 2019-06-22 NOTE — ED Provider Notes (Signed)
[facility]   [id number] 06/22/19 Arrival Time: 1434  ASSESSMENT & PLAN:  1. Penile discharge     Declines HIV/RPR testing. Urethral cytology pending.   Discharge Instructions     You have been given the following medications today for treatment of suspected gonorrhea and/or chlamydia:  cefTRIAXone (ROCEPHIN) injection 250 mg azithromycin (ZITHROMAX) tablet 1,000 mg  Even though we have treated you today, we have sent testing for sexually transmitted infections. We will notify you of any positive results once they are received. If required, we will prescribe any medications you might need.  Please refrain from all sexual activity for at least the next seven days.     Pending: Labs Reviewed  CYTOLOGY, (ORAL, ANAL, URETHRAL) ANCILLARY ONLY    Will notify of any positive results. Instructed to refrain from sexual activity for at least seven days.  Reviewed expectations re: course of current medical issues. Questions answered. Outlined signs and symptoms indicating need for more acute intervention. Patient verbalized understanding. After Visit Summary given.   SUBJECTIVE:  Jeffery SnipeByron L Santos is a 38 y.o. male who presents with complaint of penile discharge. Onset gradual. First noticed 1-2 d ago. Describes discharge as thick and white/yellow. Urinary symptoms: none. Afebrile. No abdominal or pelvic pain. No n/v. No rashes or lesions. Sexually active with single male partner. Treated for gonorrhea approx 3 weeks ago; symptoms resolved; same sexual partner. OTC treatment: none.  ROS: As per HPI. All other systems negative.   OBJECTIVE:  Vitals:   06/22/19 1532  BP: (!) 143/97  Pulse: 71  Resp: 16  Temp: 98.2 F (36.8 C)  TempSrc: Oral  SpO2: 100%    General appearance: alert, cooperative, appears stated age and no distress Throat: lips, mucosa, and tongue normal; teeth and gums normal CV: RRR Lungs: CTAB Back: no CVA tenderness; FROM at waist Abdomen:  soft, non-tender GU: normal genitalia Skin: warm and dry Psychological: alert and cooperative; normal mood and affect.   Labs Reviewed  CYTOLOGY, (ORAL, ANAL, URETHRAL) ANCILLARY ONLY    Allergies  Allergen Reactions  . Vicodin [Hydrocodone-Acetaminophen]      Family History  Problem Relation Age of Onset  . Healthy Mother   . Healthy Father    Social History   Socioeconomic History  . Marital status: Single    Spouse name: Not on file  . Number of children: Not on file  . Years of education: Not on file  . Highest education level: Not on file  Occupational History  . Not on file  Social Needs  . Financial resource strain: Not on file  . Food insecurity    Worry: Not on file    Inability: Not on file  . Transportation needs    Medical: Not on file    Non-medical: Not on file  Tobacco Use  . Smoking status: Current Every Day Smoker    Types: Cigarettes  Substance and Sexual Activity  . Alcohol use: Yes    Comment: occasionally  . Drug use: No  . Sexual activity: Not on file  Lifestyle  . Physical activity    Days per week: Not on file    Minutes per session: Not on file  . Stress: Not on file  Relationships  . Social Musicianconnections    Talks on phone: Not on file    Gets together: Not on file    Attends religious service: Not on file    Active member of club or organization: Not on file  Attends meetings of clubs or organizations: Not on file    Relationship status: Not on file  . Intimate partner violence    Fear of current or ex partner: Not on file    Emotionally abused: Not on file    Physically abused: Not on file    Forced sexual activity: Not on file  Other Topics Concern  . Not on file  Social History Narrative  . Not on file          Vanessa Kick, MD 06/22/19 [phone]

## 2019-06-22 NOTE — Discharge Instructions (Addendum)

## 2019-06-24 LAB — CYTOLOGY, (ORAL, ANAL, URETHRAL) ANCILLARY ONLY
Chlamydia: NEGATIVE
Neisseria Gonorrhea: NEGATIVE
Trichomonas: NEGATIVE

## 2019-07-05 ENCOUNTER — Other Ambulatory Visit: Payer: Self-pay

## 2019-07-05 ENCOUNTER — Encounter (HOSPITAL_COMMUNITY): Payer: Self-pay | Admitting: Emergency Medicine

## 2019-07-05 ENCOUNTER — Ambulatory Visit (HOSPITAL_COMMUNITY)
Admission: EM | Admit: 2019-07-05 | Discharge: 2019-07-05 | Disposition: A | Discharge status: Home / Self Care | Payer: HRSA Program | Attending: Family Medicine | Admitting: Family Medicine

## 2019-07-05 DIAGNOSIS — R05 Cough: Secondary | ICD-10-CM

## 2019-07-05 DIAGNOSIS — Z20822 Contact with and (suspected) exposure to covid-19: Secondary | ICD-10-CM

## 2019-07-05 DIAGNOSIS — Z20828 Contact with and (suspected) exposure to other viral communicable diseases: Secondary | ICD-10-CM | POA: Insufficient documentation

## 2019-07-05 NOTE — ED Provider Notes (Signed)
[facility]    CSN: [id number] Arrival date & time: 07/05/19  1010      History   Chief Complaint Chief Complaint  Patient presents with  . Covid Exposure    HPI Jeffery Santos is a 38 y.o. male no significant past medical history presenting today for evaluation of possible COVID exposure.  Patient states that he was recently around 2 friends that stayed at his house that had been to [facility].  He is unsure if they had covered or not, but 1 of them had a slight cough.  He is needing a COVID test in order to return to work.  He states that he has had a mild cough, but this is normal for him.  Otherwise has felt normal.  HPI  History reviewed. No pertinent past medical history.  There are no active problems to display for this patient.   Past Surgical History:  Procedure Laterality Date  . ANKLE ARTHROSCOPY WITH OPEN REDUCTION INTERNAL FIXATION (ORIF)         Home Medications    Prior to Admission medications   Medication Sig Start Date End Date Taking? Authorizing Provider  naproxen (NAPROSYN) 500 MG tablet Take 1 tablet (500 mg total) by mouth 2 (two) times daily. 05/10/19   Domenick Gong, MD    Family History Family History  Problem Relation Age of Onset  . Healthy Mother   . Healthy Father     Social History Social History   Tobacco Use  . Smoking status: Current Every Day Smoker    Types: Cigarettes  . Smokeless tobacco: Never Used  Substance Use Topics  . Alcohol use: Yes    Comment: occasionally  . Drug use: No     Allergies   Vicodin [hydrocodone-acetaminophen]   Review of Systems Review of Systems  Constitutional: Negative for activity change, appetite change, chills, fatigue and fever.  HENT: Negative for congestion, ear pain, rhinorrhea, sinus pressure, sore throat and trouble swallowing.   Eyes: Negative for discharge and redness.  Respiratory: Positive for cough. Negative for chest tightness and shortness of breath.    Cardiovascular: Negative for chest pain.  Gastrointestinal: Negative for abdominal pain, diarrhea, nausea and vomiting.  Musculoskeletal: Negative for myalgias.  Skin: Negative for rash.  Neurological: Negative for dizziness, light-headedness and headaches.     Physical Exam Triage Vital Signs ED Triage Vitals  Enc Vitals Group     BP 07/05/19 1049 135/85     Pulse Rate 07/05/19 1049 77     Resp 07/05/19 1049 12     Temp 07/05/19 1049 97.8 F (36.6 C)     Temp Source 07/05/19 1049 Temporal     SpO2 07/05/19 1049 98 %     Weight --      Height --      Head Circumference --      Peak Flow --      Pain Score 07/05/19 1047 0     Pain Loc --      Pain Edu? --      Excl. in GC? --    No data found.  Updated Vital Signs BP 135/85 (BP Location: Left Wrist)   Pulse 77   Temp 97.8 F (36.6 C) (Temporal)   Resp 12   SpO2 98%   Visual Acuity Right Eye Distance:   Left Eye Distance:   Bilateral Distance:    Right Eye Near:   Left Eye Near:    Bilateral Near:  Physical Exam Vitals signs and nursing note reviewed.  Constitutional:      Appearance: He is well-developed.  HENT:     Head: Normocephalic and atraumatic.  Eyes:     Conjunctiva/sclera: Conjunctivae normal.  Neck:     Musculoskeletal: Neck supple.  Cardiovascular:     Rate and Rhythm: Normal rate and regular rhythm.     Heart sounds: No murmur.  Pulmonary:     Effort: Pulmonary effort is normal. No respiratory distress.     Breath sounds: Normal breath sounds.  Abdominal:     Palpations: Abdomen is soft.     Tenderness: There is no abdominal tenderness.  Skin:    General: Skin is warm and dry.  Neurological:     Mental Status: He is alert.      UC Treatments / Results  Labs (all labs ordered are listed, but only abnormal results are displayed) Labs Reviewed  NOVEL CORONAVIRUS, NAA (HOSP ORDER, SEND-OUT TO REF LAB; TAT 18-24 HRS)    EKG   Radiology No results  found.  Procedures Procedures (including critical care time)  Medications Ordered in UC Medications - No data to display  Initial Impression / Assessment and Plan / UC Course  I have reviewed the triage vital signs and the nursing notes.  Pertinent labs & imaging results that were available during my care of the patient were reviewed by me and considered in my medical decision making (see chart for details).     COVID swab obtained.  Will have him remain home from work until results return.  Advised to rest and drink plenty of fluids.  Continue to monitor for development of symptoms.Discussed strict return precautions. Patient verbalized understanding and is agreeable with plan.  Final Clinical Impressions(s) / UC Diagnoses   Final diagnoses:  Exposure to Covid-19 Virus     Discharge Instructions        Person Under Monitoring Name: Arna SnipeByron L Blyden  [place] [address] [place] [id number]   Infection Prevention Recommendations for Individuals Confirmed to have, or Being Evaluated for, 2019 Novel Coronavirus (COVID-19) Infection Who Receive Care at Home  Individuals who are confirmed to have, or are being evaluated for, COVID-19 should follow the prevention steps below until a healthcare provider or local or state health department says they can return to normal activities.  Stay home except to get medical care You should restrict activities outside your home, except for getting medical care. Do not go to work, school, or public areas, and do not use public transportation or taxis.  Call ahead before visiting your doctor Before your medical appointment, call the healthcare provider and tell them that you have, or are being evaluated for, COVID-19 infection. This will help the healthcare provider's office take steps to keep other people from getting infected. Ask your healthcare provider to call the local or state health department.  Monitor your  symptoms Seek prompt medical attention if your illness is worsening (e.g., difficulty breathing). Before going to your medical appointment, call the healthcare provider and tell them that you have, or are being evaluated for, COVID-19 infection. Ask your healthcare provider to call the local or state health department.  Wear a facemask You should wear a facemask that covers your nose and mouth when you are in the same room with other people and when you visit a healthcare provider. People who live with or visit you should also wear a facemask while they are in the same room with  you.  Separate yourself from other people in your home As much as possible, you should stay in a different room from other people in your home. Also, you should use a separate bathroom, if available.  Avoid sharing household items You should not share dishes, drinking glasses, cups, eating utensils, towels, bedding, or other items with other people in your home. After using these items, you should wash them thoroughly with soap and water.  Cover your coughs and sneezes Cover your mouth and nose with a tissue when you cough or sneeze, or you can cough or sneeze into your sleeve. Throw used tissues in a lined trash can, and immediately wash your hands with soap and water for at least 20 seconds or use an alcohol-based hand rub.  Wash your [facility] your hands often and thoroughly with soap and water for at least 20 seconds. You can use an alcohol-based hand sanitizer if soap and water are not available and if your hands are not visibly dirty. Avoid touching your eyes, nose, and mouth with unwashed hands.   Prevention Steps for Caregivers and Household Members of Individuals Confirmed to have, or Being Evaluated for, COVID-19 Infection Being Cared for in the Home  If you live with, or provide care at home for, a person confirmed to have, or being evaluated for, COVID-19 infection please follow these guidelines  to prevent infection:  Follow healthcare provider's instructions Make sure that you understand and can help the patient follow any healthcare provider instructions for all care.  Provide for the patient's basic needs You should help the patient with basic needs in the home and provide support for getting groceries, prescriptions, and other personal needs.  Monitor the patient's symptoms If they are getting sicker, call his or her medical provider and tell them that the patient has, or is being evaluated for, COVID-19 infection. This will help the healthcare provider's office take steps to keep other people from getting infected. Ask the healthcare provider to call the local or state health department.  Limit the number of people who have contact with the patient  If possible, have only one caregiver for the patient.  Other household members should stay in another home or place of residence. If this is not possible, they should stay  in another room, or be separated from the patient as much as possible. Use a separate bathroom, if available.  Restrict visitors who do not have an essential need to be in the home.  Keep older adults, very young children, and other sick people away from the patient Keep older adults, very young children, and those who have compromised immune systems or chronic health conditions away from the patient. This includes people with chronic heart, lung, or kidney conditions, diabetes, and cancer.  Ensure good ventilation Make sure that shared spaces in the home have good air flow, such as from an air conditioner or an opened window, weather permitting.  Wash your hands often  Wash your hands often and thoroughly with soap and water for at least 20 seconds. You can use an alcohol based hand sanitizer if soap and water are not available and if your hands are not visibly dirty.  Avoid touching your eyes, nose, and mouth with unwashed hands.  Use disposable  paper towels to dry your hands. If not available, use dedicated cloth towels and replace them when they become wet.  Wear a facemask and gloves  Wear a disposable facemask at all times in the room and  gloves when you touch or have contact with the patient's blood, body fluids, and/or secretions or excretions, such as sweat, saliva, sputum, nasal mucus, vomit, urine, or feces.  Ensure the mask fits over your nose and mouth tightly, and do not touch it during use.  Throw out disposable facemasks and gloves after using them. Do not reuse.  Wash your hands immediately after removing your facemask and gloves.  If your personal clothing becomes contaminated, carefully remove clothing and launder. Wash your hands after handling contaminated clothing.  Place all used disposable facemasks, gloves, and other waste in a lined container before disposing them with other household waste.  Remove gloves and wash your hands immediately after handling these items.  Do not share dishes, glasses, or other household items with the patient  Avoid sharing household items. You should not share dishes, drinking glasses, cups, eating utensils, towels, bedding, or other items with a patient who is confirmed to have, or being evaluated for, COVID-19 infection.  After the person uses these items, you should wash them thoroughly with soap and water.  Wash laundry thoroughly  Immediately remove and wash clothes or bedding that have blood, body fluids, and/or secretions or excretions, such as sweat, saliva, sputum, nasal mucus, vomit, urine, or feces, on them.  Wear gloves when handling laundry from the patient.  Read and follow directions on labels of laundry or clothing items and detergent. In general, wash and dry with the warmest temperatures recommended on the label.  Clean all areas the individual has used often  Clean all touchable surfaces, such as counters, tabletops, doorknobs, bathroom fixtures, toilets,  phones, keyboards, tablets, and bedside tables, every day. Also, clean any surfaces that may have blood, body fluids, and/or secretions or excretions on them.  Wear gloves when cleaning surfaces the patient has come in contact with.  Use a diluted bleach solution (e.g., dilute bleach with 1 part bleach and 10 parts water) or a household disinfectant with a label that says EPA-registered for coronaviruses. To make a bleach solution at home, add 1 tablespoon of bleach to 1 quart (4 cups) of water. For a larger supply, add  cup of bleach to 1 gallon (16 cups) of water.  Read labels of cleaning products and follow recommendations provided on product labels. Labels contain instructions for safe and effective use of the cleaning product including precautions you should take when applying the product, such as wearing gloves or eye protection and making sure you have good ventilation during use of the product.  Remove gloves and wash hands immediately after cleaning.  Monitor yourself for signs and symptoms of illness Caregivers and household members are considered close contacts, should monitor their health, and will be asked to limit movement outside of the home to the extent possible. Follow the monitoring steps for close contacts listed on the symptom monitoring form.   ? If you have additional questions, contact your local health department or call the epidemiologist on call at [phone] (available 24/7). ? This guidance is subject to change. For the most up-to-date guidance from [facility], please refer to their website: TripMetro.huhttps://www.cdc.gov/coronavirus/2019-ncov/hcp/guidance-prevent-spread.html    ED Prescriptions    None     Controlled Substance Prescriptions [place] Controlled Substance Registry consulted? Not Applicable   Lew DawesWieters,  C, [place]-C 07/05/19 1115

## 2019-07-05 NOTE — Discharge Instructions (Signed)
Person Under Monitoring Name: Jeffery Santos  Location: [address] [place] [address]   Infection Prevention Recommendations for Individuals Confirmed to have, or Being Evaluated for, 2019 Novel Coronavirus (COVID-19) Infection Who Receive Care at Home  Individuals who are confirmed to have, or are being evaluated for, COVID-19 should follow the prevention steps below until a healthcare provider or local or state health department says they can return to normal activities.  Stay home except to get medical care You should restrict activities outside your home, except for getting medical care. Do not go to work, school, or public areas, and do not use public transportation or taxis.  Call ahead before visiting your doctor Before your medical appointment, call the healthcare provider and tell them that you have, or are being evaluated for, COVID-19 infection. This will help the healthcare providers office take steps to keep other people from getting infected. Ask your healthcare provider to call the local or state health department.  Monitor your symptoms Seek prompt medical attention if your illness is worsening (e.g., difficulty breathing). Before going to your medical appointment, call the healthcare provider and tell them that you have, or are being evaluated for, COVID-19 infection. Ask your healthcare provider to call the local or state health department.  Wear a facemask You should wear a facemask that covers your nose and mouth when you are in the same room with other people and when you visit a healthcare provider. People who live with or visit you should also wear a facemask while they are in the same room with you.  Separate yourself from other people in your home As much as possible, you should stay in a different room from other people in your home. Also, you should use a separate bathroom, if available.  Avoid sharing household items You should not  share dishes, drinking glasses, cups, eating utensils, towels, bedding, or other items with other people in your home. After using these items, you should wash them thoroughly with soap and water.  Cover your coughs and sneezes Cover your mouth and nose with a tissue when you cough or sneeze, or you can cough or sneeze into your sleeve. Throw used tissues in a lined trash can, and immediately wash your hands with soap and water for at least 20 seconds or use an alcohol-based hand rub.  Wash your [facility] your hands often and thoroughly with soap and water for at least 20 seconds. You can use an alcohol-based hand sanitizer if soap and water are not available and if your hands are not visibly dirty. Avoid touching your eyes, nose, and mouth with unwashed hands.   Prevention Steps for Caregivers and Household Members of Individuals Confirmed to have, or Being Evaluated for, COVID-19 Infection Being Cared for in the Home  If you live with, or provide care at home for, a person confirmed to have, or being evaluated for, COVID-19 infection please follow these guidelines to prevent infection:  Follow healthcare providers instructions Make sure that you understand and can help the patient follow any healthcare provider instructions for all care.  Provide for the patients basic needs You should help the patient with basic needs in the home and provide support for getting groceries, prescriptions, and other personal needs.  Monitor the patients symptoms If they are getting sicker, call his or her medical provider and tell them that the patient has, or is being evaluated for, COVID-19 infection. This will help the healthcare providers  office take steps to keep other people from getting infected. Ask the healthcare provider to call the local or state health department.  Limit the number of people who have contact with the patient If possible, have only one caregiver for the  patient. Other household members should stay in another home or place of residence. If this is not possible, they should stay in another room, or be separated from the patient as much as possible. Use a separate bathroom, if available. Restrict visitors who do not have an essential need to be in the home.  Keep older adults, very young children, and other sick people away from the patient Keep older adults, very young children, and those who have compromised immune systems or chronic health conditions away from the patient. This includes people with chronic heart, lung, or kidney conditions, diabetes, and cancer.  Ensure good ventilation Make sure that shared spaces in the home have good air flow, such as from an air conditioner or an opened window, weather permitting.  Wash your hands often Wash your hands often and thoroughly with soap and water for at least 20 seconds. You can use an alcohol based hand sanitizer if soap and water are not available and if your hands are not visibly dirty. Avoid touching your eyes, nose, and mouth with unwashed hands. Use disposable paper towels to dry your hands. If not available, use dedicated cloth towels and replace them when they become wet.  Wear a facemask and gloves Wear a disposable facemask at all times in the room and gloves when you touch or have contact with the patients blood, body fluids, and/or secretions or excretions, such as sweat, saliva, sputum, nasal mucus, vomit, urine, or feces.  Ensure the mask fits over your nose and mouth tightly, and do not touch it during use. Throw out disposable facemasks and gloves after using them. Do not reuse. Wash your hands immediately after removing your facemask and gloves. If your personal clothing becomes contaminated, carefully remove clothing and launder. Wash your hands after handling contaminated clothing. Place all used disposable facemasks, gloves, and other waste in a lined container before  disposing them with other household waste. Remove gloves and wash your hands immediately after handling these items.  Do not share dishes, glasses, or other household items with the patient Avoid sharing household items. You should not share dishes, drinking glasses, cups, eating utensils, towels, bedding, or other items with a patient who is confirmed to have, or being evaluated for, COVID-19 infection. After the person uses these items, you should wash them thoroughly with soap and water.  Wash laundry thoroughly Immediately remove and wash clothes or bedding that have blood, body fluids, and/or secretions or excretions, such as sweat, saliva, sputum, nasal mucus, vomit, urine, or feces, on them. Wear gloves when handling laundry from the patient. Read and follow directions on labels of laundry or clothing items and detergent. In general, wash and dry with the warmest temperatures recommended on the label.  Clean all areas the individual has used often Clean all touchable surfaces, such as counters, tabletops, doorknobs, bathroom fixtures, toilets, phones, keyboards, tablets, and bedside tables, every day. Also, clean any surfaces that may have blood, body fluids, and/or secretions or excretions on them. Wear gloves when cleaning surfaces the patient has come in contact with. Use a diluted bleach solution (e.g., dilute bleach with 1 part bleach and 10 parts water) or a household disinfectant with a label that says EPA-registered for coronaviruses. To make a  bleach solution at home, add 1 tablespoon of bleach to 1 quart (4 cups) of water. For a larger supply, add  cup of bleach to 1 gallon (16 cups) of water. Read labels of cleaning products and follow recommendations provided on product labels. Labels contain instructions for safe and effective use of the cleaning product including precautions you should take when applying the product, such as wearing gloves or eye protection and making sure you  have good ventilation during use of the product. Remove gloves and wash hands immediately after cleaning.  Monitor yourself for signs and symptoms of illness Caregivers and household members are considered close contacts, should monitor their health, and will be asked to limit movement outside of the home to the extent possible. Follow the monitoring steps for close contacts listed on the symptom monitoring form.   ? If you have additional questions, contact your local health department or call the epidemiologist on call at [phone] (available 24/7). ? This guidance is subject to change. For the most up-to-date guidance from [facility], please refer to their website: YouBlogs.pl

## 2019-07-05 NOTE — ED Triage Notes (Signed)
Pt states he had two friends who went to [facility] last week.  They spent the night at his house on Saturday night.  He called his workplace and told them he had been exposed to Covid-19 because they had been to the beach.  He states one of them had a dry cough and was thinking about getting tested.  He does not have any symptoms himself.

## 2019-07-06 LAB — NOVEL CORONAVIRUS, NAA (HOSP ORDER, SEND-OUT TO REF LAB; TAT 18-24 HRS): SARS-CoV-2, NAA: NOT DETECTED

## 2019-08-02 ENCOUNTER — Other Ambulatory Visit: Payer: Self-pay

## 2019-08-02 ENCOUNTER — Ambulatory Visit (HOSPITAL_COMMUNITY)
Admission: EM | Admit: 2019-08-02 | Discharge: 2019-08-02 | Disposition: A | Discharge status: Home / Self Care | Payer: Self-pay | Attending: Emergency Medicine | Admitting: Emergency Medicine

## 2019-08-02 ENCOUNTER — Encounter (HOSPITAL_COMMUNITY): Payer: Self-pay

## 2019-08-02 DIAGNOSIS — F17210 Nicotine dependence, cigarettes, uncomplicated: Secondary | ICD-10-CM | POA: Insufficient documentation

## 2019-08-02 DIAGNOSIS — J019 Acute sinusitis, unspecified: Secondary | ICD-10-CM | POA: Insufficient documentation

## 2019-08-02 DIAGNOSIS — Z20828 Contact with and (suspected) exposure to other viral communicable diseases: Secondary | ICD-10-CM | POA: Insufficient documentation

## 2019-08-02 DIAGNOSIS — R0981 Nasal congestion: Secondary | ICD-10-CM | POA: Insufficient documentation

## 2019-08-02 DIAGNOSIS — R05 Cough: Secondary | ICD-10-CM | POA: Insufficient documentation

## 2019-08-02 MED ORDER — AMOXICILLIN-POT CLAVULANATE 875-125 MG PO TABS: 1.0000 | ORAL_TABLET | Freq: Two times a day (BID) | ORAL | 0 refills | Status: DC

## 2019-08-02 NOTE — Discharge Instructions (Addendum)
Take the antibiotic as prescribed.  You can also take ibuprofen and Mucinex as needed for sinus congestion and discomfort.    Return here or follow-up with your primary care provider if your symptoms are not improving or you develop new symptoms such as fever, sore throat, or other concerns.

## 2019-08-02 NOTE — ED Triage Notes (Signed)
Pt states he has cough and congestion. This has been like this for weeks. Pt states he has been tested for Covid. Pt states the results was negative.

## 2019-08-02 NOTE — ED Provider Notes (Signed)
[facility]    CSN: [id number] Arrival date & time: 08/02/19  1555      History   Chief Complaint Chief Complaint  Patient presents with  . congestion  . Cough    HPI Jeffery Santos is a 38 y.o. male.   Patient presents with sinus congestion, cough productive of yellow phlegm, sinus pressure x3 to 4 weeks.  He denies fever, chills, sore throat, shortness of breath, vomiting, diarrhea, rash, or other symptoms.  He had a negative COVID test on 07/05/2019.  No treatments attempted at home.  The history is provided by the patient.    History reviewed. No pertinent past medical history.  There are no active problems to display for this patient.   Past Surgical History:  Procedure Laterality Date  . ANKLE ARTHROSCOPY WITH OPEN REDUCTION INTERNAL FIXATION (ORIF)         Home Medications    Prior to Admission medications   Medication Sig Start Date End Date Taking? Authorizing Provider  amoxicillin-clavulanate (AUGMENTIN) 875-125 MG tablet Take 1 tablet by mouth every 12 (twelve) hours. 08/02/19   Mickie Bail, NP  naproxen (NAPROSYN) 500 MG tablet Take 1 tablet (500 mg total) by mouth 2 (two) times daily. 05/10/19   Domenick Gong, MD    Family History Family History  Problem Relation Age of Onset  . Healthy Mother   . Healthy Father     Social History Social History   Tobacco Use  . Smoking status: Current Every Day Smoker    Types: Cigarettes  . Smokeless tobacco: Never Used  Substance Use Topics  . Alcohol use: Yes    Comment: occasionally  . Drug use: No     Allergies   Vicodin [hydrocodone-acetaminophen]   Review of Systems Review of Systems  Constitutional: Negative for chills and fever.  HENT: Positive for congestion and sinus pressure. Negative for ear pain and sore throat.   Eyes: Negative for pain and visual disturbance.  Respiratory: Positive for cough. Negative for shortness of breath.   Cardiovascular: Negative for chest pain  and palpitations.  Gastrointestinal: Negative for abdominal pain, diarrhea and vomiting.  Genitourinary: Negative for dysuria and hematuria.  Musculoskeletal: Negative for arthralgias and back pain.  Skin: Negative for color change and rash.  Neurological: Negative for seizures and syncope.  All other systems reviewed and are negative.    Physical Exam Triage Vital Signs ED Triage Vitals  Enc Vitals Group     BP 08/02/19 1632 112/76     Pulse Rate 08/02/19 1632 89     Resp 08/02/19 1632 18     Temp 08/02/19 1632 98.8 F (37.1 C)     Temp Source 08/02/19 1632 Oral     SpO2 08/02/19 1632 98 %     Weight 08/02/19 1631 258 lb (117 kg)     Height --      Head Circumference --      Peak Flow --      Pain Score --      Pain Loc --      Pain Edu? --      Excl. in [place]? --    No data found.  Updated Vital Signs BP 112/76 (BP Location: Right Arm)   Pulse 89   Temp 98.8 F (37.1 C) (Oral)   Resp 18   Wt 258 lb (117 kg)   SpO2 98%   Visual Acuity Right Eye Distance:   Left Eye Distance:   Bilateral  Distance:    Right Eye Near:   Left Eye Near:    Bilateral Near:     Physical Exam Vitals signs and nursing note reviewed.  Constitutional:      Appearance: He is well-developed.  HENT:     Head: Normocephalic and atraumatic.     Right Ear: Tympanic membrane normal.     Left Ear: Tympanic membrane normal.     Nose: Congestion present.     Mouth/Throat:     Mouth: Mucous membranes are moist.     Pharynx: Oropharynx is clear.  Eyes:     Conjunctiva/sclera: Conjunctivae normal.  Neck:     Musculoskeletal: Neck supple.  Cardiovascular:     Rate and Rhythm: Normal rate and regular rhythm.     Heart sounds: No murmur.  Pulmonary:     Effort: Pulmonary effort is normal. No respiratory distress.     Breath sounds: Normal breath sounds. No wheezing or rhonchi.  Abdominal:     Palpations: Abdomen is soft.     Tenderness: There is no abdominal tenderness.  Skin:     General: Skin is warm and dry.     Findings: No rash.  Neurological:     Mental Status: He is alert.      UC Treatments / Results  Labs (all labs ordered are listed, but only abnormal results are displayed) Labs Reviewed - No data to display  EKG   Radiology No results found.  Procedures Procedures (including critical care time)  Medications Ordered in UC Medications - No data to display  Initial Impression / Assessment and Plan / UC Course  I have reviewed the triage vital signs and the nursing notes.  Pertinent labs & imaging results that were available during my care of the patient were reviewed by me and considered in my medical decision making (see chart for details).    Sinusitis.  Treating with Augmentin.  Instructed patient he can also take ibuprofen or Mucinex as needed.  Instructed him to return here or follow-up with his PCP if his symptoms or not improving.  COVID test performed here today.  Instructed patient to self quarantine until the test result is back.  Patient agrees to plan of care.     Final Clinical Impressions(s) / UC Diagnoses   Final diagnoses:  Acute non-recurrent sinusitis, unspecified location     Discharge Instructions     Take the antibiotic as prescribed.  You can also take ibuprofen and Mucinex as needed for sinus congestion and discomfort.    Return here or follow-up with your primary care provider if your symptoms are not improving or you develop new symptoms such as fever, sore throat, or other concerns.        ED Prescriptions    Medication Sig Dispense Auth. Provider   amoxicillin-clavulanate (AUGMENTIN) 875-125 MG tablet Take 1 tablet by mouth every 12 (twelve) hours. 14 tablet Sharion Balloon, NP     PDMP not reviewed this encounter.   Sharion Balloon, NP 08/02/19 1655

## 2019-08-04 LAB — NOVEL CORONAVIRUS, NAA (HOSP ORDER, SEND-OUT TO REF LAB; TAT 18-24 HRS): SARS-CoV-2, NAA: NOT DETECTED

## 2019-12-12 ENCOUNTER — Ambulatory Visit (HOSPITAL_COMMUNITY)
Admission: EM | Admit: 2019-12-12 | Discharge: 2019-12-12 | Disposition: A | Discharge status: Home / Self Care | Payer: Self-pay | Attending: Family Medicine | Admitting: Family Medicine

## 2019-12-12 ENCOUNTER — Encounter (HOSPITAL_COMMUNITY): Payer: Self-pay

## 2019-12-12 ENCOUNTER — Other Ambulatory Visit: Payer: Self-pay

## 2019-12-12 DIAGNOSIS — R369 Urethral discharge, unspecified: Secondary | ICD-10-CM | POA: Insufficient documentation

## 2019-12-12 LAB — POCT URINALYSIS DIP (DEVICE)
Bilirubin Urine: NEGATIVE
Glucose, UA: NEGATIVE mg/dL
Ketones, ur: NEGATIVE mg/dL
Leukocytes,Ua: NEGATIVE
Nitrite: NEGATIVE
Protein, ur: NEGATIVE mg/dL
Specific Gravity, Urine: >=1.03 (ref 1.005–1.030)
Urobilinogen, UA: 0.2 mg/dL (ref 0.0–1.0)
pH: 5.5 (ref 5.0–8.0)

## 2019-12-12 MED ORDER — CEFTRIAXONE SODIUM 500 MG IJ SOLR
500.0000 mg | Freq: Once | INTRAMUSCULAR | Status: AC
  Administered 2019-12-12: 17:00:00 500 mg via INTRAMUSCULAR

## 2019-12-12 MED ORDER — LIDOCAINE HCL (PF) 1 % IJ SOLN
INTRAMUSCULAR | Status: AC
  Filled 2019-12-12: qty 2

## 2019-12-12 MED ORDER — CEFTRIAXONE SODIUM 500 MG IJ SOLR
INTRAMUSCULAR | Status: AC
  Filled 2019-12-12: qty 500

## 2019-12-12 MED ORDER — DOXYCYCLINE HYCLATE 100 MG PO CAPS: 100.0000 mg | ORAL_CAPSULE | Freq: Two times a day (BID) | ORAL | 0 refills | Status: DC

## 2019-12-12 NOTE — ED Triage Notes (Signed)
Pt is here for STD testing & states this is his 3rd visit because of his partner. States he is having odor/discharge for 2 days now.

## 2019-12-12 NOTE — ED Provider Notes (Signed)
[place]    CSN: [id number] Arrival date & time: 12/12/19  1639      History   Chief Complaint Chief Complaint  Patient presents with  . S74.5    HPI Jeffery Santos is a 39 y.o. male.   HPI Patient is here for STD check.  He has penile discharge for 2 days Denies rash  denies fever  Declines blood work History reviewed. No pertinent past medical history.  There are no problems to display for this patient.   Past Surgical History:  Procedure Laterality Date  . ANKLE ARTHROSCOPY WITH OPEN REDUCTION INTERNAL FIXATION (ORIF)         Home Medications    Prior to Admission medications   Medication Sig Start Date End Date Taking? Authorizing Provider  ascorbic acid (VITAMIN C) 500 MG tablet Take by mouth. 05/21/17  Yes [provider]  aspirin 81 MG EC tablet Take by mouth. 05/21/17  Yes [provider]  calcium carbonate (OS-CAL) 1250 (500 Ca) MG chewable tablet Chew by mouth. 05/21/17  Yes [provider]  ibuprofen (ADVIL) 800 MG tablet Take by mouth. 09/10/15  Yes [provider]  gabapentin (NEURONTIN) 100 MG capsule Take by mouth. 05/21/17 12/12/19 Yes [provider]  promethazine (PHENERGAN) 12.5 MG tablet Take by mouth. 05/21/17 12/12/19 Yes [provider]  ARIPiprazole (ABILIFY) 10 MG tablet Take by mouth.    [provider]  cloNIDine (CATAPRES) 0.1 MG tablet Take by mouth.    [provider]  doxycycline (VIBRAMYCIN) 100 MG capsule Take 1 capsule (100 mg total) by mouth 2 (two) times daily. 12/12/19   Raylene Everts, MD  FLUoxetine (PROZAC) 20 MG capsule Take by mouth.    [provider]    Family History Family History  Problem Relation Age of Onset  . Healthy Mother   . Healthy Father     Social History Social History   Tobacco Use  . Smoking status: Current Every Day Smoker    Types: Cigarettes  . Smokeless tobacco: Never Used  Substance Use Topics  .  Alcohol use: Yes    Comment: occasionally  . Drug use: No     Allergies   Hydrocodone-acetaminophen   Review of Systems Review of Systems  Genitourinary: Positive for discharge and penile pain. Negative for dysuria.    Physical Exam Triage Vital Signs ED Triage Vitals  Enc Vitals Group     BP 12/12/19 1701 (!) 157/85     Pulse Rate 12/12/19 1701 91     Resp 12/12/19 1701 19     Temp 12/12/19 1701 98.4 F (36.9 C)     Temp Source 12/12/19 1701 Oral     SpO2 12/12/19 1701 96 %     Weight 12/12/19 1658 266 lb 6.4 oz (120.8 kg)     Height --      Head Circumference --      Peak Flow --      Pain Score 12/12/19 1658 0     Pain Loc --      Pain Edu? --      Excl. in [place]? --    No data found.  Updated Vital Signs BP (!) 157/85 (BP Location: Right Arm)   Pulse 91   Temp 98.4 F (36.9 C) (Oral)   Resp 19   Wt 120.8 kg   SpO2 96%     Physical Exam Constitutional:      General: He is not in  acute distress.    Appearance: He is well-developed. He is obese.  HENT:     Head: Normocephalic and atraumatic.     Mouth/Throat:     Comments: Mask in place Eyes:     Conjunctiva/sclera: Conjunctivae normal.     Pupils: Pupils are equal, round, and reactive to light.  Cardiovascular:     Rate and Rhythm: Normal rate.  Pulmonary:     Effort: Pulmonary effort is normal. No respiratory distress.  Genitourinary:    Penis: Normal.      Comments: Normal circumcised penis.  No discharge.  Swab obtained Musculoskeletal:        General: Normal range of motion.     Cervical back: Normal range of motion.  Skin:    General: Skin is warm and dry.  Neurological:     Mental Status: He is alert.      UC Treatments / Results  Labs (all labs ordered are listed, but only abnormal results are displayed) Labs Reviewed  POCT URINALYSIS DIP (DEVICE) - Abnormal; Notable for the following components:      Result Value   Hgb urine dipstick TRACE (*)    All other components within  normal limits  CYTOLOGY, (ORAL, ANAL, URETHRAL) ANCILLARY ONLY    EKG   Radiology No results found.  Procedures Procedures (including critical care time)  Medications Ordered in UC Medications  cefTRIAXone (ROCEPHIN) injection 500 mg (500 mg Intramuscular Given 12/12/19 1719)    Initial Impression / Assessment and Plan / UC Course  I have reviewed the triage vital signs and the nursing notes.  Pertinent labs & imaging results that were available during my care of the patient were reviewed by me and considered in my medical decision making (see chart for details).      Final Clinical Impressions(s) / UC Diagnoses   Final diagnoses:  Penile discharge     Discharge Instructions     Take doxycycline 2 times a day for 7 days Avoid sexual relations until you have completed your doxycycline   ED Prescriptions    Medication Sig Dispense Auth. Provider   doxycycline (VIBRAMYCIN) 100 MG capsule Take 1 capsule (100 mg total) by mouth 2 (two) times daily. 14 capsule Eustace Moore, MD     PDMP not reviewed this encounter.   Eustace Moore, MD 12/12/19 1919

## 2019-12-12 NOTE — Discharge Instructions (Signed)
Take doxycycline 2 times a day for 7 days Avoid sexual relations until you have completed your doxycycline

## 2019-12-14 LAB — CYTOLOGY, (ORAL, ANAL, URETHRAL) ANCILLARY ONLY
Chlamydia: NEGATIVE
Neisseria Gonorrhea: NEGATIVE
Trichomonas: NEGATIVE

## 2020-01-28 ENCOUNTER — Encounter (HOSPITAL_COMMUNITY): Payer: Self-pay | Admitting: *Deleted

## 2020-01-28 ENCOUNTER — Ambulatory Visit (HOSPITAL_COMMUNITY)
Admission: EM | Admit: 2020-01-28 | Discharge: 2020-01-28 | Disposition: A | Discharge status: Home / Self Care | Payer: Self-pay | Attending: Family Medicine | Admitting: Family Medicine

## 2020-01-28 ENCOUNTER — Other Ambulatory Visit: Payer: Self-pay

## 2020-01-28 DIAGNOSIS — R369 Urethral discharge, unspecified: Secondary | ICD-10-CM | POA: Insufficient documentation

## 2020-01-28 DIAGNOSIS — B356 Tinea cruris: Secondary | ICD-10-CM | POA: Insufficient documentation

## 2020-01-28 MED ORDER — CLOTRIMAZOLE 1 % EX CREA: TOPICAL_CREAM | CUTANEOUS | 0 refills | Status: DC

## 2020-01-28 MED ORDER — CEFTRIAXONE SODIUM 500 MG IJ SOLR
500.0000 mg | Freq: Once | INTRAMUSCULAR | Status: AC
  Administered 2020-01-28: 500 mg via INTRAMUSCULAR

## 2020-01-28 MED ORDER — DOXYCYCLINE HYCLATE 100 MG PO CAPS: 100.0000 mg | ORAL_CAPSULE | Freq: Two times a day (BID) | ORAL | 0 refills | Status: DC

## 2020-01-28 MED ORDER — CEFTRIAXONE SODIUM 500 MG IJ SOLR
INTRAMUSCULAR | Status: AC
  Filled 2020-01-28: qty 500

## 2020-01-28 NOTE — Discharge Instructions (Addendum)

## 2020-01-28 NOTE — ED Triage Notes (Signed)
C/O penile discharge x 2 days.  Denies fevers.

## 2020-01-30 NOTE — ED Provider Notes (Signed)
[facility]   [id number] 01/28/20 Arrival Time: 1610  ASSESSMENT & PLAN:  1. Penile discharge   2. Tinea cruris     Meds ordered this encounter  Medications  . doxycycline (VIBRAMYCIN) 100 MG capsule    Sig: Take 1 capsule (100 mg total) by mouth 2 (two) times daily.    Dispense:  14 capsule    Refill:  0  . clotrimazole (LOTRIMIN) 1 % cream    Sig: Apply to affected area 2 times daily    Dispense:  28 g    Refill:  0  . cefTRIAXone (ROCEPHIN) injection 500 mg      Discharge Instructions     You have been given the following today for treatment of suspected gonorrhea and/or chlamydia:  cefTRIAXone (ROCEPHIN) injection 500 mg  Please pick up your prescription for doxycycline 100 mg and begin taking twice daily for the next seven (7) days.  Even though we have treated you today, we have sent testing for sexually transmitted infections. We will notify you of any positive results once they are received. If required, we will prescribe any medications you might need.  Please refrain from all sexual activity for at least the next seven days.     Pending: Labs Reviewed  CYTOLOGY, (ORAL, ANAL, URETHRAL) ANCILLARY ONLY    Will notify of any positive results. Instructed to refrain from sexual activity for at least seven days.  Reviewed expectations re: course of current medical issues. Questions answered. Outlined signs and symptoms indicating need for more acute intervention. Patient verbalized understanding. After Visit Summary given.   SUBJECTIVE:  Jeffery Santos is a 39 y.o. male who presents with complaint of penile discharge. Onset abrupt. First noticed approx 2 d ago. Describes discharge as thick and opaque. No specific aggravating or alleviating factors reported. Denies: urinary frequency, dysuria and gross hematuria. Afebrile. No abdominal or pelvic pain. No n/v. No rashes or lesions. Reports that he is sexually active with single male partner. Also  with on/off burning rash in folds of groin; some itching. OTC treatment: none reported. History of STI: none reported.   OBJECTIVE:  Vitals:   01/28/20 1627  BP: 130/75  Pulse: 77  Resp: 14  Temp: 98.1 F (36.7 C)  TempSrc: Oral  SpO2: 99%     General appearance: alert, cooperative, appears stated age and no distress Throat: lips, mucosa, and tongue normal; teeth and gums normal Lungs: unlabored respirations Back: no CVA tenderness; FROM at waist GU: normal appearing genitalia Skin: warm and dry; tinea cruris without signs of bacterial infection Psychological: alert and cooperative; normal mood and affect.  Results for orders placed or performed during the hospital encounter of 12/12/19  POCT urinalysis dip (device)  Result Value Ref Range   Glucose, UA NEGATIVE NEGATIVE mg/dL   Bilirubin Urine NEGATIVE NEGATIVE   Ketones, ur NEGATIVE NEGATIVE mg/dL   Specific Gravity, Urine >=1.030 1.005 - 1.030   Hgb urine dipstick TRACE (A) NEGATIVE   pH 5.5 5.0 - 8.0   Protein, ur NEGATIVE NEGATIVE mg/dL   Urobilinogen, UA 0.2 0.0 - 1.0 mg/dL   Nitrite NEGATIVE NEGATIVE   Leukocytes,Ua NEGATIVE NEGATIVE    Labs Reviewed  CYTOLOGY, (ORAL, ANAL, URETHRAL) ANCILLARY ONLY    Allergies  Allergen Reactions  . Hydrocodone-Acetaminophen     History reviewed. No pertinent past medical history. Family History  Problem Relation Age of Onset  . Healthy Mother   . Healthy Father    Social History  Socioeconomic History  . Marital status: Single    Spouse name: Not on file  . Number of children: Not on file  . Years of education: Not on file  . Highest education level: Not on file  Occupational History  . Not on file  Tobacco Use  . Smoking status: Current Every Day Smoker    Types: Cigarettes  . Smokeless tobacco: Never Used  Substance and Sexual Activity  . Alcohol use: Yes    Comment: occasionally  . Drug use: No  . Sexual activity: Yes  Other Topics Concern  .  Not on file  Social History Narrative  . Not on file   Social Determinants of Health   Financial Resource Strain:   . Difficulty of Paying Living Expenses:   Food Insecurity:   . Worried About Charity fundraiser in the Last Year:   . Arboriculturist in the Last Year:   Transportation Needs:   . Film/video editor (Medical):   Marland Kitchen Lack of Transportation (Non-Medical):   Physical Activity:   . Days of Exercise per Week:   . Minutes of Exercise per Session:   Stress:   . Feeling of Stress :   Social Connections:   . Frequency of Communication with Friends and Family:   . Frequency of Social Gatherings with Friends and Family:   . Attends Religious Services:   . Active Member of Clubs or Organizations:   . Attends Archivist Meetings:   Marland Kitchen Marital Status:   Intimate Partner Violence:   . Fear of Current or Ex-Partner:   . Emotionally Abused:   Marland Kitchen Physically Abused:   . Sexually Abused:           Vanessa Kick, MD 01/30/20 [phone]

## 2020-01-31 ENCOUNTER — Encounter (HOSPITAL_COMMUNITY): Payer: Self-pay | Admitting: Emergency Medicine

## 2020-01-31 ENCOUNTER — Other Ambulatory Visit: Payer: Self-pay

## 2020-01-31 ENCOUNTER — Ambulatory Visit (HOSPITAL_COMMUNITY)
Admission: EM | Admit: 2020-01-31 | Discharge: 2020-01-31 | Disposition: A | Discharge status: Home / Self Care | Payer: Self-pay | Attending: Physician Assistant | Admitting: Physician Assistant

## 2020-01-31 DIAGNOSIS — M545 Low back pain, unspecified: Secondary | ICD-10-CM

## 2020-01-31 DIAGNOSIS — A599 Trichomoniasis, unspecified: Secondary | ICD-10-CM

## 2020-01-31 LAB — CYTOLOGY, (ORAL, ANAL, URETHRAL) ANCILLARY ONLY
Chlamydia: NEGATIVE
Neisseria Gonorrhea: NEGATIVE
Trichomonas: POSITIVE — AB

## 2020-01-31 MED ORDER — IBUPROFEN 600 MG PO TABS: 600.0000 mg | ORAL_TABLET | Freq: Four times a day (QID) | ORAL | 0 refills | Status: DC | PRN

## 2020-01-31 MED ORDER — METRONIDAZOLE 500 MG PO TABS: 500.0000 mg | ORAL_TABLET | Freq: Two times a day (BID) | ORAL | 0 refills | Status: DC

## 2020-01-31 MED ORDER — TIZANIDINE HCL 4 MG PO TABS: 4.0000 mg | ORAL_TABLET | Freq: Four times a day (QID) | ORAL | 0 refills | Status: DC | PRN

## 2020-01-31 MED ORDER — KETOROLAC TROMETHAMINE 60 MG/2ML IM SOLN
INTRAMUSCULAR | Status: AC
  Filled 2020-01-31: qty 2

## 2020-01-31 MED ORDER — KETOROLAC TROMETHAMINE 60 MG/2ML IM SOLN
60.0000 mg | Freq: Once | INTRAMUSCULAR | Status: AC
  Administered 2020-01-31: 60 mg via INTRAMUSCULAR

## 2020-01-31 NOTE — ED Triage Notes (Signed)
Pt here for lower back pain after pulling things around at work yesterday

## 2020-01-31 NOTE — ED Provider Notes (Addendum)
[facility]    CSN: [id number] Arrival date & time: 01/31/20  1226      History   Chief Complaint Chief Complaint  Patient presents with  . Back Pain    HPI Jeffery Santos is a 39 y.o. male.   Patient presents for acute onset of low back pain after pulling heavy objects at work.  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  He noted onset of low back pain through the evening and then this morning.  He reports he initially woke and rose from bed with out a lot of pain however notes bending to pick something up in the house and getting in his car having sharp onset of pain primary on the right side of his low back.  Pain is been worse with bending and movement.  It hurts to rise from a seated position.  Denies radiation down his legs.  Denies numbness or tingling.  Denies a history of back injury.  Note patient was seen on 01/28/2020 for urethral discharge.  He was tested for STDs.  He received Rocephin and doxycycline prescription.  This test results returned with trichomoniasis positive.     History reviewed. No pertinent past medical history.  There are no problems to display for this patient.   Past Surgical History:  Procedure Laterality Date  . ANKLE ARTHROSCOPY WITH OPEN REDUCTION INTERNAL FIXATION (ORIF)         Home Medications    Prior to Admission medications   Medication Sig Start Date End Date Taking? Authorizing Provider  ARIPiprazole (ABILIFY) 10 MG tablet Take by mouth.    [provider]  clotrimazole (LOTRIMIN) 1 % cream Apply to affected area 2 times daily Patient not taking: Reported on 01/31/2020 01/28/20   Mardella Layman, MD  ibuprofen (ADVIL) 600 MG tablet Take 1 tablet (600 mg total) by mouth every 6 (six) hours as needed. 01/31/20   Trevis Eden, Veryl Speak, PA-C  metroNIDAZOLE (FLAGYL) 500 MG tablet Take 1 tablet (500 mg total) by mouth 2 (two) times daily. 01/31/20   Tonetta Napoles, Veryl Speak,  PA-C  tiZANidine (ZANAFLEX) 4 MG tablet Take 1 tablet (4 mg total) by mouth every 6 (six) hours as needed for muscle spasms. 01/31/20   Jetaime Pinnix, Veryl Speak, PA-C  calcium carbonate (OS-CAL) 1250 (500 Ca) MG chewable tablet Chew by mouth. 05/21/17 01/28/20  [provider]  cloNIDine (CATAPRES) 0.1 MG tablet Take by mouth.  01/28/20  [provider]  FLUoxetine (PROZAC) 20 MG capsule Take by mouth.  01/28/20  [provider]  gabapentin (NEURONTIN) 100 MG capsule Take by mouth. 05/21/17 12/12/19  [provider]  promethazine (PHENERGAN) 12.5 MG tablet Take by mouth. 05/21/17 12/12/19  [provider]    Family History Family History  Problem Relation Age of Onset  . Healthy Mother   . Healthy Father     Social History Social History   Tobacco Use  . Smoking status: Current Every Day Smoker    Types: Cigarettes  . Smokeless tobacco: Never Used  Substance Use Topics  . Alcohol use: Yes    Comment: occasionally  . Drug use: No     Allergies   Hydrocodone-acetaminophen   Review of Systems Review of Systems  Musculoskeletal: Positive for back pain. Negative for neck pain and neck stiffness.  Neurological: Negative for weakness and numbness.     Physical Exam Triage Vital Signs ED Triage Vitals [01/31/20 1246]  Enc Vitals Group     BP 108/79     Pulse Rate 86     Resp 18     Temp 98.3 F (36.8 C)     Temp Source Oral     SpO2 99 %     Weight      Height      Head Circumference      Peak Flow      Pain Score 7     Pain Loc      Pain Edu?      Excl. in [place]?    No data found.  Updated Vital Signs BP 108/79 (BP Location: Right Arm)   Pulse 86   Temp 98.3 F (36.8 C) (Oral)   Resp 18   SpO2 99%   Visual Acuity Right Eye Distance:   Left Eye Distance:   Bilateral Distance:    Right Eye Near:   Left Eye Near:    Bilateral Near:     Physical Exam Vitals and nursing note reviewed.  Constitutional:      Appearance: He is  well-developed.  HENT:     Head: Normocephalic and atraumatic.  Eyes:     Conjunctiva/sclera: Conjunctivae normal.  Cardiovascular:     Rate and Rhythm: Normal rate.     Heart sounds: No murmur.  Pulmonary:     Effort: Pulmonary effort is normal. No respiratory distress.  Musculoskeletal:     Cervical back: Neck supple.     Comments: Tenderness to palpation of bilateral paraspinals with more emphasis on the right paraspinals.  No midline tenderness.  There is right-sided spasm with knotting.  Straight leg raise negative bilaterally.  Strength equal bilaterally 5/5.  Sensation grossly intact bilaterally.  Skin:    General: Skin is warm and dry.  Neurological:     General: No focal deficit present.     Mental Status: He is alert and oriented to person, place, and time.      UC Treatments / Results  Labs (all labs ordered are listed, but only abnormal results are displayed) Labs Reviewed - No data to display  EKG   Radiology No results found.  Procedures Procedures (including critical care time)  Medications Ordered in UC Medications  ketorolac (TORADOL) injection 60 mg (has no administration in time range)    Initial Impression / Assessment and Plan / UC Course  I have reviewed the triage vital signs and the nursing notes.  Pertinent labs & imaging results that were available during my care of the patient were reviewed by me and considered in my medical decision making (see chart for details).  Review of 01/28/2020 note and results of STI testing was conducted.    #acute low back pain #Trichomoniasis Patient 39 year old presenting with acute low back pain.  There is no alarm symptoms at this time.  Toradol in clinic.  Ibuprofen muscle relaxer outpatient therapy.  Light duty for 1 week.  Metronidazole was prescribed for recent trichomoniasis positive.  Instructed to stop doxycycline as he was not chlamydia positive or gonorrhea positive.  Patient verbalized  understanding and agrees with the plan.  Final Clinical Impressions(s) / UC Diagnoses   Final diagnoses:  Acute bilateral low back pain without sciatica  Trichomoniasis     Discharge Instructions     Take the metronidazole twice a day for 7 days.  Abstain from sex since we have completed this entire treatment.  Practice safe sex with condoms.  Take the ibuprofen every 6-8 hours.  Take the Zanaflex primarily at night as this will make you sleepy.  Ensure you have 6 to 8 hours prior to going to work or driving when taking this. The amount of heavy lifting and heavy pulling you have to do for the next 1 week.  Such that no much lifting over 20 pounds from a bending position. May return to work if you can find alternative duties to these.  Continue to move and follow the low back stretching attached.    ED Prescriptions    Medication Sig Dispense Auth. Provider   ibuprofen (ADVIL) 600 MG tablet Take 1 tablet (600 mg total) by mouth every 6 (six) hours as needed. 30 tablet Shigeko Manard, Marguerita Beards, PA-C   tiZANidine (ZANAFLEX) 4 MG tablet Take 1 tablet (4 mg total) by mouth every 6 (six) hours as needed for muscle spasms. 30 tablet Paula Zietz, Marguerita Beards, PA-C   metroNIDAZOLE (FLAGYL) 500 MG tablet Take 1 tablet (500 mg total) by mouth 2 (two) times daily. 14 tablet Lennex Pietila, Marguerita Beards, PA-C     PDMP not reviewed this encounter.   Purnell Shoemaker, PA-C 01/31/20 1407    Jenesis Martin, Marguerita Beards, PA-C 02/01/20 0009

## 2020-01-31 NOTE — Discharge Instructions (Signed)
Take the metronidazole twice a day for 7 days.  Abstain from sex since we have completed this entire treatment.  Practice safe sex with condoms.  Take the ibuprofen every 6-8 hours.   Take the Zanaflex primarily at night as this will make you sleepy.  Ensure you have 6 to 8 hours prior to going to work or driving when taking this. The amount of heavy lifting and heavy pulling you have to do for the next 1 week.  Such that no much lifting over 20 pounds from a bending position. May return to work if you can find alternative duties to these.  Continue to move and follow the low back stretching attached.

## 2020-02-28 ENCOUNTER — Encounter (HOSPITAL_COMMUNITY): Payer: Self-pay

## 2020-02-28 ENCOUNTER — Other Ambulatory Visit: Payer: Self-pay

## 2020-02-28 ENCOUNTER — Ambulatory Visit (HOSPITAL_COMMUNITY)
Admission: EM | Admit: 2020-02-28 | Discharge: 2020-02-28 | Disposition: A | Discharge status: Home / Self Care | Payer: BLUE CROSS/BLUE SHIELD | Attending: Physician Assistant | Admitting: Physician Assistant

## 2020-02-28 DIAGNOSIS — M5442 Lumbago with sciatica, left side: Secondary | ICD-10-CM | POA: Diagnosis not present

## 2020-02-28 DIAGNOSIS — G8929 Other chronic pain: Secondary | ICD-10-CM

## 2020-02-28 DIAGNOSIS — M25572 Pain in left ankle and joints of left foot: Secondary | ICD-10-CM | POA: Diagnosis not present

## 2020-02-28 MED ORDER — TIZANIDINE HCL 4 MG PO TABS: 4.0000 mg | ORAL_TABLET | Freq: Four times a day (QID) | ORAL | 0 refills | Status: DC | PRN

## 2020-02-28 MED ORDER — IBUPROFEN 600 MG PO TABS: 600.0000 mg | ORAL_TABLET | Freq: Four times a day (QID) | ORAL | 0 refills | Status: DC | PRN

## 2020-02-28 NOTE — ED Provider Notes (Signed)
[facility]    CSN: [id number] Arrival date & time: 02/28/20  1438      History   Chief Complaint Chief Complaint  Patient presents with  . Ankle Pain    Left   . Back Pain    HPI Jeffery Santos is a 39 y.o. male.   Patient presents for evaluation of chronic left ankle pain and back pain.  He reports the ankle has given him problems for several years since he had an operation on it.  He reports swelling and pain intermittently. There is nothing in particular that causes his ankle to flare in pain and swelling. He reports he works a fairly active job and this can times interfere with his ability to perform his duties. He will take over-the-counter medications and this will resolve for short period and then return.  He is hoping to have some more final answers on why he continues to have chronic left ankle pain.  He reports chronic low back pain that occasionally shoots down his left leg.  He was seen for similar on 01/31/2020. He reports pain is improved since that time but continues to have some low back soreness. He also wonders if his ankle pain which causes him to limp at times is contributing to his back pain.  Denies numbness or weakness in the legs.  No reported issues with bowel or bladder incontinence.     History reviewed. No pertinent past medical history.  There are no problems to display for this patient.   Past Surgical History:  Procedure Laterality Date  . ANKLE ARTHROSCOPY WITH OPEN REDUCTION INTERNAL FIXATION (ORIF)         Home Medications    Prior to Admission medications   Medication Sig Start Date End Date Taking? Authorizing Provider  ARIPiprazole (ABILIFY) 10 MG tablet Take by mouth.    [provider]  clotrimazole (LOTRIMIN) 1 % cream Apply to affected area 2 times daily Patient not taking: Reported on 01/31/2020 01/28/20   Vanessa Kick, MD  ibuprofen (ADVIL) 600 MG tablet Take 1 tablet (600 mg total) by mouth every 6 (six) hours  as needed. 02/28/20   Everson Mott, Marguerita Beards, PA-C  metroNIDAZOLE (FLAGYL) 500 MG tablet Take 1 tablet (500 mg total) by mouth 2 (two) times daily. 01/31/20   Gracen Southwell, Marguerita Beards, PA-C  tiZANidine (ZANAFLEX) 4 MG tablet Take 1 tablet (4 mg total) by mouth every 6 (six) hours as needed for muscle spasms. 02/28/20   Muzammil Bruins, Marguerita Beards, PA-C  calcium carbonate (OS-CAL) 1250 (500 Ca) MG chewable tablet Chew by mouth. 05/21/17 01/28/20  [provider]  cloNIDine (CATAPRES) 0.1 MG tablet Take by mouth.  01/28/20  [provider]  FLUoxetine (PROZAC) 20 MG capsule Take by mouth.  01/28/20  [provider]  gabapentin (NEURONTIN) 100 MG capsule Take by mouth. 05/21/17 12/12/19  [provider]  promethazine (PHENERGAN) 12.5 MG tablet Take by mouth. 05/21/17 12/12/19  [provider]    Family History Family History  Problem Relation Age of Onset  . Healthy Mother   . Healthy Father     Social History Social History   Tobacco Use  . Smoking status: Current Every Day Smoker    Types: Cigarettes  . Smokeless tobacco: Never Used  Substance Use Topics  . Alcohol use: Yes    Comment: occasionally  . Drug use: No     Allergies   Hydrocodone-acetaminophen   Review of Systems Review of Systems Per  HPI  Physical Exam Triage Vital Signs ED Triage Vitals  Enc Vitals Group     BP 02/28/20 1513 (!) 154/69     Pulse Rate 02/28/20 1513 83     Resp 02/28/20 1513 18     Temp 02/28/20 1513 98.1 F (36.7 C)     Temp Source 02/28/20 1513 Oral     SpO2 02/28/20 1513 97 %     Weight --      Height --      Head Circumference --      Peak Flow --      Pain Score 02/28/20 1505 7     Pain Loc --      Pain Edu? --      Excl. in GC? --    No data found.  Updated Vital Signs BP (!) 154/69 (BP Location: Right Arm)   Pulse 83   Temp 98.1 F (36.7 C) (Oral)   Resp 18   SpO2 97%   Visual Acuity Right Eye Distance:   Left Eye Distance:   Bilateral Distance:    Right Eye  Near:   Left Eye Near:    Bilateral Near:     Physical Exam Vitals and nursing note reviewed.  Constitutional:      General: He is not in acute distress.    Appearance: He is well-developed. He is not ill-appearing.  HENT:     Head: Normocephalic and atraumatic.  Cardiovascular:     Rate and Rhythm: Normal rate.  Pulmonary:     Effort: Pulmonary effort is normal. No respiratory distress.  Musculoskeletal:     Cervical back: Neck supple.     Comments: Back: Mild tenderness bilaterally in lumbar paraspinals. There is mild spasm. Patient is ambulatory.  Left ankle with mild effusion. Some tenderness palpation just anterior to the Achilles. Pulses good. Complete range of motion.  Skin:    General: Skin is warm and dry.     Findings: No rash.  Neurological:     General: No focal deficit present.     Mental Status: He is alert and oriented to person, place, and time.      UC Treatments / Results  Labs (all labs ordered are listed, but only abnormal results are displayed) Labs Reviewed - No data to display  EKG   Radiology No results found.  Procedures Procedures (including critical care time)  Medications Ordered in UC Medications - No data to display  Initial Impression / Assessment and Plan / UC Course  I have reviewed the triage vital signs and the nursing notes.  Pertinent labs & imaging results that were available during my care of the patient were reviewed by me and considered in my medical decision making (see chart for details).     #Chronic ankle pain #Chronic back pain Patient is a 39 year old male with history of chronic left ankle pain chronic back pain. 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 Since that time he has had a handful of visits for chronic ankle pain. Given continued pain lack response to NSAID therapies in the past will refer patient to sports medicine office. Discussed scheduled  ibuprofen, ice and elevation the ankle when it is giving him trouble. Regard to his back will do another short course of Zanaflex. Discussed using this as needed. Alarm symptoms of low back pain were discussed. Patient verbalized understanding. Final Clinical Impressions(s) / UC Diagnoses   Final diagnoses:  Chronic pain  of left ankle  Chronic bilateral low back pain with left-sided sciatica     Discharge Instructions     Call the sports medicine group for evaluation  Take the zanaflex every 6 hours as needed for back spasm. This will make you sleepy, so take primarily at bed time. Do not drive, drink or go to work within 8 hours of taking this  Take the ibuprofen every 6 hours for the next 3-4 days and then as needed  If you have issues with weakness or numbness in your legs, loss of control off bowel or bladder please return.     ED Prescriptions    Medication Sig Dispense Auth. Provider   tiZANidine (ZANAFLEX) 4 MG tablet Take 1 tablet (4 mg total) by mouth every 6 (six) hours as needed for muscle spasms. 30 tablet Bexlee Bergdoll, Veryl Speak, PA-C   ibuprofen (ADVIL) 600 MG tablet Take 1 tablet (600 mg total) by mouth every 6 (six) hours as needed. 30 tablet Taheera Thomann, Veryl Speak, PA-C     PDMP not reviewed this encounter.   Hermelinda Medicus, PA-C 02/28/20 2330

## 2020-02-28 NOTE — Discharge Instructions (Signed)
Call the sports medicine group for evaluation  Take the zanaflex every 6 hours as needed for back spasm. This will make you sleepy, so take primarily at bed time. Do not drive, drink or go to work within 8 hours of taking this  Take the ibuprofen every 6 hours for the next 3-4 days and then as needed  If you have issues with weakness or numbness in your legs, loss of control off bowel or bladder please return.

## 2020-02-28 NOTE — ED Triage Notes (Signed)
Pt presents with recurrent lower back pain and ongoing left ankle pain from an injury from a few years ago.

## 2020-03-19 ENCOUNTER — Other Ambulatory Visit: Payer: Self-pay | Admitting: Urology

## 2020-03-23 ENCOUNTER — Other Ambulatory Visit (HOSPITAL_COMMUNITY): Payer: BLUE CROSS/BLUE SHIELD

## 2020-03-27 ENCOUNTER — Ambulatory Visit (HOSPITAL_BASED_OUTPATIENT_CLINIC_OR_DEPARTMENT_OTHER): Admit: 2020-03-27 | Payer: BLUE CROSS/BLUE SHIELD | Admitting: Urology

## 2020-03-27 SURGERY — CIRCUMCISION, ADULT
Anesthesia: General

## 2020-04-02 ENCOUNTER — Other Ambulatory Visit: Payer: Self-pay

## 2020-04-02 ENCOUNTER — Encounter (HOSPITAL_COMMUNITY): Payer: Self-pay

## 2020-04-02 ENCOUNTER — Ambulatory Visit (HOSPITAL_COMMUNITY)
Admission: EM | Admit: 2020-04-02 | Discharge: 2020-04-02 | Disposition: A | Discharge status: Home / Self Care | Payer: BLUE CROSS/BLUE SHIELD | Attending: Family Medicine | Admitting: Family Medicine

## 2020-04-02 DIAGNOSIS — J069 Acute upper respiratory infection, unspecified: Secondary | ICD-10-CM

## 2020-04-02 DIAGNOSIS — J0100 Acute maxillary sinusitis, unspecified: Secondary | ICD-10-CM

## 2020-04-02 MED ORDER — FLUTICASONE PROPIONATE 50 MCG/ACT NA SUSP: 2.0000 | Freq: Every day | NASAL | 2 refills | Status: DC

## 2020-04-02 MED ORDER — AMOXICILLIN 875 MG PO TABS: 875.0000 mg | ORAL_TABLET | Freq: Two times a day (BID) | ORAL | 0 refills | Status: DC

## 2020-04-02 NOTE — Discharge Instructions (Addendum)
  Sure that you rest and drink a lot of fluids Take the antibiotic 2 times a day Start Flonase twice a day.  This will help with the nasal dressed discharge and drainage Should start see improvement within a couple of days

## 2020-04-02 NOTE — ED Provider Notes (Signed)
[facility]    CSN: [id number] Arrival date & time: 04/02/20  1413      History   Chief Complaint Chief Complaint  Patient presents with  . Nasal Congestion  . Cough    HPI Jeffery Santos is a 39 y.o. male.   HPI  Patient is here for upper respiratory infection.  He states that he has had allergy symptoms with sinus drainage, clear rhinorrhea, face pressure and postnasal drip for over a week.  For the last 3 or 4 days his symptoms have changed to have a thick yellow mucus from his nose, increased pressure and pain in his sinuses, increased coughing and chest congestion.  He feels like he has been running a fever.  He is developed fatigue.  No shortness of breath.  No chest pain.  No nausea or vomiting.  He states that he thinks he has a sinus infection.  He states that he gets these periodically.  History reviewed. No pertinent past medical history.  There are no problems to display for this patient.   Past Surgical History:  Procedure Laterality Date  . ANKLE ARTHROSCOPY WITH OPEN REDUCTION INTERNAL FIXATION (ORIF)         Home Medications    Prior to Admission medications   Medication Sig Start Date End Date Taking? Authorizing Provider  amoxicillin (AMOXIL) 875 MG tablet Take 1 tablet (875 mg total) by mouth 2 (two) times daily. 04/02/20   Raylene Everts, MD  ARIPiprazole (ABILIFY) 10 MG tablet Take by mouth.    [provider]  fluticasone (FLONASE) 50 MCG/ACT nasal spray Place 2 sprays into both nostrils daily. 04/02/20   Raylene Everts, MD  ibuprofen (ADVIL) 600 MG tablet Take 1 tablet (600 mg total) by mouth every 6 (six) hours as needed. 02/28/20   Darr, Marguerita Beards, PA-C  tiZANidine (ZANAFLEX) 4 MG tablet Take 1 tablet (4 mg total) by mouth every 6 (six) hours as needed for muscle spasms. 02/28/20   Darr, Marguerita Beards, PA-C  calcium carbonate (OS-CAL) 1250 (500 Ca) MG chewable tablet Chew by mouth. 05/21/17 01/28/20  [provider]   cloNIDine (CATAPRES) 0.1 MG tablet Take by mouth.  01/28/20  [provider]  FLUoxetine (PROZAC) 20 MG capsule Take by mouth.  01/28/20  [provider]  gabapentin (NEURONTIN) 100 MG capsule Take by mouth. 05/21/17 12/12/19  [provider]  promethazine (PHENERGAN) 12.5 MG tablet Take by mouth. 05/21/17 12/12/19  [provider]    Family History Family History  Problem Relation Age of Onset  . Healthy Mother   . Healthy Father     Social History Social History   Tobacco Use  . Smoking status: Current Every Day Smoker    Types: Cigarettes  . Smokeless tobacco: Never Used  Substance Use Topics  . Alcohol use: Yes    Comment: occasionally  . Drug use: No     Allergies   Hydrocodone-acetaminophen   Review of Systems Review of Systems  Constitutional: Positive for appetite change, fatigue and fever.  HENT: Positive for congestion, postnasal drip, rhinorrhea, sinus pressure, sinus pain and sore throat.   Respiratory: Positive for cough. Negative for shortness of breath.   Cardiovascular: Negative for chest pain.  Gastrointestinal: Negative for nausea and vomiting.     Physical Exam Triage Vital Signs ED Triage Vitals  Enc Vitals Group     BP 04/02/20 1540 131/69     Pulse Rate 04/02/20 1540 86  Resp 04/02/20 1540 20     Temp 04/02/20 1540 99 F (37.2 C)     Temp Source 04/02/20 1540 Oral     SpO2 04/02/20 1540 97 %     Weight --      Height --      Head Circumference --      Peak Flow --      Pain Score 04/02/20 1541 0     Pain Loc --      Pain Edu? --      Excl. in GC? --    No data found.  Updated Vital Signs BP 131/69 (BP Location: Right Arm)   Pulse 86   Temp 99 F (37.2 C) (Oral)   Resp 20   SpO2 97%       Physical Exam Constitutional:      General: He is not in acute distress.    Appearance: He is well-developed. He is ill-appearing.  HENT:     Head: Normocephalic and atraumatic.     Right Ear:  Tympanic membrane normal.     Left Ear: Tympanic membrane and ear canal normal.     Nose: Congestion and rhinorrhea present.     Comments: Nasopharynx are swollen and red posterior pharynx is red.  Yellow PND    Mouth/Throat:     Pharynx: Posterior oropharyngeal erythema present.  Eyes:     Conjunctiva/sclera: Conjunctivae normal.     Pupils: Pupils are equal, round, and reactive to light.  Cardiovascular:     Rate and Rhythm: Normal rate and regular rhythm.  Pulmonary:     Effort: Pulmonary effort is normal. No respiratory distress.     Breath sounds: Normal breath sounds.  Musculoskeletal:        General: Normal range of motion.     Cervical back: Normal range of motion.  Lymphadenopathy:     Cervical: Cervical adenopathy present.  Skin:    General: Skin is warm and dry.  Neurological:     Mental Status: He is alert.  Psychiatric:        Mood and Affect: Mood normal.        Behavior: Behavior normal.      UC Treatments / Results  Labs (all labs ordered are listed, but only abnormal results are displayed) Labs Reviewed - No data to display  EKG   Radiology No results found.  Procedures Procedures (including critical care time)  Medications Ordered in UC Medications - No data to display  Initial Impression / Assessment and Plan / UC Course  I have reviewed the triage vital signs and the nursing notes.  Pertinent labs & imaging results that were available during my care of the patient were reviewed by me and considered in my medical decision making (see chart for details).     Reviewed with patient is that most sinus infections are caused by virus.  He did have symptoms for over a week, symptoms change, worsening symptoms.  For this reason I will cover him with an antibiotic. Final Clinical Impressions(s) / UC Diagnoses   Final diagnoses:  Acute non-recurrent maxillary sinusitis  Viral upper respiratory tract infection     Discharge Instructions       Sure that you rest and drink a lot of fluids Take the antibiotic 2 times a day Start Flonase twice a day.  This will help with the nasal dressed discharge and drainage Should start see improvement within a couple of days   ED Prescriptions  Medication Sig Dispense Auth. Provider   fluticasone (FLONASE) 50 MCG/ACT nasal spray Place 2 sprays into both nostrils daily. 16 g Eustace Moore, MD   amoxicillin (AMOXIL) 875 MG tablet Take 1 tablet (875 mg total) by mouth 2 (two) times daily. 14 tablet Eustace Moore, MD     PDMP not reviewed this encounter.   Eustace Moore, MD 04/02/20 [phone]

## 2020-04-02 NOTE — ED Triage Notes (Addendum)
Pt reports nasal congestion, pressure in his eyes, cough and phlegm in his throat x 3-4 days. Pt took Mucinex and Robitussin  without relief.  Pt do not want a COVID test.

## 2020-09-24 ENCOUNTER — Ambulatory Visit (HOSPITAL_COMMUNITY)
Admission: EM | Admit: 2020-09-24 | Discharge: 2020-09-24 | Disposition: A | Discharge status: Home / Self Care | Payer: HRSA Program | Attending: Family Medicine | Admitting: Family Medicine

## 2020-09-24 ENCOUNTER — Other Ambulatory Visit: Payer: Self-pay

## 2020-09-24 ENCOUNTER — Encounter (HOSPITAL_COMMUNITY): Payer: Self-pay | Admitting: *Deleted

## 2020-09-24 DIAGNOSIS — J069 Acute upper respiratory infection, unspecified: Secondary | ICD-10-CM | POA: Diagnosis not present

## 2020-09-24 DIAGNOSIS — Z1152 Encounter for screening for COVID-19: Secondary | ICD-10-CM

## 2020-09-24 DIAGNOSIS — Z20822 Contact with and (suspected) exposure to covid-19: Secondary | ICD-10-CM | POA: Diagnosis present

## 2020-09-24 DIAGNOSIS — J3089 Other allergic rhinitis: Secondary | ICD-10-CM | POA: Diagnosis present

## 2020-09-24 MED ORDER — PREDNISONE 50 MG PO TABS: ORAL_TABLET | ORAL | 0 refills | Status: DC

## 2020-09-24 MED ORDER — AMOXICILLIN-POT CLAVULANATE 875-125 MG PO TABS: 1.0000 | ORAL_TABLET | Freq: Two times a day (BID) | ORAL | 0 refills | Status: DC

## 2020-09-24 MED ORDER — FLUTICASONE PROPIONATE 50 MCG/ACT NA SUSP: 1.0000 | Freq: Two times a day (BID) | NASAL | 2 refills | Status: DC

## 2020-09-24 MED ORDER — CETIRIZINE HCL 10 MG PO TABS: 10.0000 mg | ORAL_TABLET | Freq: Every day | ORAL | 1 refills | Status: DC

## 2020-09-24 NOTE — ED Triage Notes (Signed)
Pt reports ongoing for  2 months a cough with lots of mucous. Pt reports he coughs the most at night. Pt also wants a covid test for travel.

## 2020-09-24 NOTE — ED Provider Notes (Signed)
[facility]    CSN: [id number] Arrival date & time: 09/24/20  1549      History   Chief Complaint Chief Complaint  Patient presents with  . Nasal Congestion  . Covid Exposure    HPI Jeffery Santos is a 39 y.o. male.   Presenting today with 2 months of hacking, productive cough, nasal congestion that worst in the mornings. Denies fever, chills, CP, SOB, abdominal pain, N/V/D, new sick contacts. Does endorse hx of allergic rhinitis, not on anything for this. Initially tried mucinex but stopped because it didn't help. Also wants a COVID test for upcoming travel.       History reviewed. No pertinent past medical history.  There are no problems to display for this patient.   Past Surgical History:  Procedure Laterality Date  . ANKLE ARTHROSCOPY WITH OPEN REDUCTION INTERNAL FIXATION (ORIF)         Home Medications    Prior to Admission medications   Medication Sig Start Date End Date Taking? Authorizing Provider  ibuprofen (ADVIL) 600 MG tablet Take 1 tablet (600 mg total) by mouth every 6 (six) hours as needed. 02/28/20  Yes Darr, Gerilyn Pilgrim, PA-C  amoxicillin (AMOXIL) 875 MG tablet Take 1 tablet (875 mg total) by mouth 2 (two) times daily. 04/02/20   Eustace Moore, MD  amoxicillin-clavulanate (AUGMENTIN) 875-125 MG tablet Take 1 tablet by mouth every 12 (twelve) hours. 09/24/20   Particia Nearing, PA-C  ARIPiprazole (ABILIFY) 10 MG tablet Take by mouth.    [provider]  cetirizine (ZYRTEC ALLERGY) 10 MG tablet Take 1 tablet (10 mg total) by mouth daily. 09/24/20   Particia Nearing, PA-C  fluticasone [facility]) 50 MCG/ACT nasal spray Place 2 sprays into both nostrils daily. 04/02/20   Eustace Moore, MD  fluticasone [facility]) 50 MCG/ACT nasal spray Place 1 spray into both nostrils 2 (two) times daily. 09/24/20   Particia Nearing, PA-C  predniSONE (DELTASONE) 50 MG tablet Take 1 tab daily with breakfast for 3 days 09/24/20   Particia Nearing, PA-C  tiZANidine (ZANAFLEX) 4 MG tablet Take 1 tablet (4 mg total) by mouth every 6 (six) hours as needed for muscle spasms. 02/28/20   Darr, Gerilyn Pilgrim, PA-C  calcium carbonate (OS-CAL) 1250 (500 Ca) MG chewable tablet Chew by mouth. 05/21/17 01/28/20  [provider]  cloNIDine (CATAPRES) 0.1 MG tablet Take by mouth.  01/28/20  [provider]  FLUoxetine (PROZAC) 20 MG capsule Take by mouth.  01/28/20  [provider]  gabapentin (NEURONTIN) 100 MG capsule Take by mouth. 05/21/17 12/12/19  [provider]  promethazine (PHENERGAN) 12.5 MG tablet Take by mouth. 05/21/17 12/12/19  [provider]    Family History Family History  Problem Relation Age of Onset  . Healthy Mother   . Healthy Father     Social History Social History   Tobacco Use  . Smoking status: Current Every Day Smoker    Types: Cigarettes  . Smokeless tobacco: Never Used  Vaping Use  . Vaping Use: Never used  Substance Use Topics  . Alcohol use: Yes    Comment: occasionally  . Drug use: No     Allergies   Hydrocodone-acetaminophen   Review of Systems Review of Systems PER HPI    Physical Exam Triage Vital Signs ED Triage Vitals  Enc Vitals Group     BP 09/24/20 1657 137/79     Pulse Rate 09/24/20 1657 94  Resp 09/24/20 1657 18     Temp 09/24/20 1657 99.1 F (37.3 C)     Temp Source 09/24/20 1657 Oral     SpO2 09/24/20 1657 95 %     Weight 09/24/20 1652 270 lb (122.5 kg)     Height 09/24/20 1652 5\' 11"  (1.803 m)     Head Circumference --      Peak Flow --      Pain Score 09/24/20 1652 0     Pain Loc --      Pain Edu? --      Excl. in GC? --    No data found.  Updated Vital Signs BP 137/79 (BP Location: Right Arm)   Pulse 94   Temp 99.1 F (37.3 C) (Oral)   Resp 18   Ht 5\' 11"  (1.803 m)   Wt 270 lb (122.5 kg)   SpO2 95%   BMI 37.66 kg/m   Visual Acuity Right Eye Distance:   Left Eye Distance:   Bilateral Distance:     Right Eye Near:   Left Eye Near:    Bilateral Near:     Physical Exam Vitals and nursing note reviewed.  Constitutional:      Appearance: Normal appearance.  HENT:     Head: Atraumatic.     Ears:     Comments: B/l middle ear effusion     Nose: Congestion present.     Mouth/Throat:     Mouth: Mucous membranes are moist.     Pharynx: Posterior oropharyngeal erythema present.  Eyes:     Extraocular Movements: Extraocular movements intact.     Conjunctiva/sclera: Conjunctivae normal.     Pupils: Pupils are equal, round, and reactive to light.  Cardiovascular:     Rate and Rhythm: Normal rate and regular rhythm.     Heart sounds: Normal heart sounds.  Pulmonary:     Effort: Pulmonary effort is normal. No respiratory distress.     Breath sounds: Normal breath sounds. No wheezing or rales.  Abdominal:     General: Bowel sounds are normal.     Palpations: Abdomen is soft.  Musculoskeletal:        General: Normal range of motion.     Cervical back: Normal range of motion and neck supple.  Skin:    General: Skin is warm and dry.  Neurological:     General: No focal deficit present.     Mental Status: He is oriented to person, place, and time.  Psychiatric:        Mood and Affect: Mood normal.        Thought Content: Thought content normal.        Judgment: Judgment normal.     UC Treatments / Results  Labs (all labs ordered are listed, but only abnormal results are displayed) Labs Reviewed  SARS CORONAVIRUS 2 (TAT 6-24 HRS)    EKG   Radiology No results found.  Procedures Procedures (including critical care time)  Medications Ordered in UC Medications - No data to display  Initial Impression / Assessment and Plan / UC Course  I have reviewed the triage vital signs and the nursing notes.  Pertinent labs & imaging results that were available during my care of the patient were reviewed by me and considered in my medical decision making (see chart for  details).     Suspect initially sxs were strictly uncontrolled allergies, but now he states worsening sxs particularly down into his chest. O2 saturation at  95%, afebrile and lungs clear on exam - will tx with short burst of prednisone, zyrtec, flonase, and augmentin if not resolving from there. COVID pcr pending per his request for screening.   Final Clinical Impressions(s) / UC Diagnoses   Final diagnoses:  Upper respiratory tract infection, unspecified type  Seasonal allergic rhinitis due to other allergic trigger  Encounter for screening for COVID-19   Discharge Instructions   None    ED Prescriptions    Medication Sig Dispense Auth. Provider   predniSONE (DELTASONE) 50 MG tablet Take 1 tab daily with breakfast for 3 days 3 tablet Particia Nearing, PA-C   amoxicillin-clavulanate (AUGMENTIN) 875-125 MG tablet Take 1 tablet by mouth every 12 (twelve) hours. 14 tablet Particia Nearing, [place]   cetirizine (ZYRTEC ALLERGY) 10 MG tablet Take 1 tablet (10 mg total) by mouth daily. 30 tablet Particia Nearing, PA-C   fluticasone [facility]) 50 MCG/ACT nasal spray Place 1 spray into both nostrils 2 (two) times daily. 16 g Particia Nearing, [place]     PDMP not reviewed this encounter.   Particia Nearing, [place] 09/24/20 1740

## 2020-09-25 LAB — SARS CORONAVIRUS 2 (TAT 6-24 HRS): SARS Coronavirus 2: NEGATIVE

## 2020-11-17 ENCOUNTER — Other Ambulatory Visit: Payer: Self-pay

## 2020-11-17 ENCOUNTER — Ambulatory Visit (HOSPITAL_COMMUNITY)
Admission: EM | Admit: 2020-11-17 | Discharge: 2020-11-17 | Disposition: A | Discharge status: Home / Self Care | Payer: Self-pay | Attending: Student | Admitting: Student

## 2020-11-17 ENCOUNTER — Encounter (HOSPITAL_COMMUNITY): Payer: Self-pay

## 2020-11-17 DIAGNOSIS — Z202 Contact with and (suspected) exposure to infections with a predominantly sexual mode of transmission: Secondary | ICD-10-CM

## 2020-11-17 MED ORDER — LIDOCAINE HCL (PF) 1 % IJ SOLN
INTRAMUSCULAR | Status: AC
  Filled 2020-11-17: qty 2

## 2020-11-17 MED ORDER — DOXYCYCLINE HYCLATE 100 MG PO CAPS: 100.0000 mg | ORAL_CAPSULE | Freq: Two times a day (BID) | ORAL | 0 refills | Status: AC

## 2020-11-17 MED ORDER — CEFTRIAXONE SODIUM 500 MG IJ SOLR
500.0000 mg | Freq: Once | INTRAMUSCULAR | Status: AC
  Administered 2020-11-17: 500 mg via INTRAMUSCULAR

## 2020-11-17 MED ORDER — DOXYCYCLINE HYCLATE 100 MG PO CAPS: 100.0000 mg | ORAL_CAPSULE | Freq: Two times a day (BID) | ORAL | 0 refills | Status: DC

## 2020-11-17 MED ORDER — CEFTRIAXONE SODIUM 500 MG IJ SOLR
INTRAMUSCULAR | Status: AC
  Filled 2020-11-17: qty 500

## 2020-11-17 NOTE — ED Triage Notes (Signed)
Patient states he has had a yellow penile discharge x 3 days as well as flank pain bilaterally x 1 week. Pt is aox4 and ambulatory.

## 2020-11-17 NOTE — ED Provider Notes (Addendum)
[facility]    CSN: [id number] Arrival date & time: 11/17/20  1343      History   Chief Complaint Chief Complaint  Patient presents with  . Penile Discharge    X 3 days  . Back Pain    Lower x 1 week    HPI Jeffery Santos is a 40 y.o. male presenting with penile discharge for 3 days and back/left ankle pain unchanged for 2 years. He initially presents to discuss both problems, but is now stating that he has somewhere to be and he does not wish to discuss his back or ankle pain today. States he's had back and ankle pain for 2 years, unchanged, and this continues to bother him. Denies pain shooting down legs, denies numbness in arms/legs, denies weakness in arms/legs, denies saddle anesthesia, denies bowel/bladder incontinence.   Patient does still wish to discuss his penile discharge. He states that he had unprotected intercourse 7 days ago with a new partner. He's now had penile discharge for 3 days. Describes this as yellowish discharge. Denies new back pain, fevers/chills, dysuria, abdominal pain, n/v/d. Feeling well otherwise. Adamant that he requires treatment today. Declines treatment for trichomonas as he will be drinking alcohol.   HPI  History reviewed. No pertinent past medical history.  There are no problems to display for this patient.   Past Surgical History:  Procedure Laterality Date  . ANKLE ARTHROSCOPY WITH OPEN REDUCTION INTERNAL FIXATION (ORIF)         Home Medications    Prior to Admission medications   Medication Sig Start Date End Date Taking? Authorizing Provider  cetirizine (ZYRTEC ALLERGY) 10 MG tablet Take 1 tablet (10 mg total) by mouth daily. 09/24/20  Yes Particia Nearing, PA-C  doxycycline (VIBRAMYCIN) 100 MG capsule Take 1 capsule (100 mg total) by mouth 2 (two) times daily. 11/17/20  Yes Rhys Martini, PA-C  fluticasone (FLONASE) 50 MCG/ACT nasal spray Place 2 sprays into both nostrils daily. 04/02/20  Yes Eustace Moore,  MD  fluticasone [facility]) 50 MCG/ACT nasal spray Place 1 spray into both nostrils 2 (two) times daily. 09/24/20  Yes Particia Nearing, PA-C  ibuprofen (ADVIL) 600 MG tablet Take 1 tablet (600 mg total) by mouth every 6 (six) hours as needed. 02/28/20  Yes Darr, Gerilyn Pilgrim, PA-C  ARIPiprazole (ABILIFY) 10 MG tablet Take by mouth.  11/17/20  [provider]  calcium carbonate (OS-CAL) 1250 (500 Ca) MG chewable tablet Chew by mouth. 05/21/17 01/28/20  [provider]  cloNIDine (CATAPRES) 0.1 MG tablet Take by mouth.  01/28/20  [provider]  FLUoxetine (PROZAC) 20 MG capsule Take by mouth.  01/28/20  [provider]  gabapentin (NEURONTIN) 100 MG capsule Take by mouth. 05/21/17 12/12/19  [provider]  promethazine (PHENERGAN) 12.5 MG tablet Take by mouth. 05/21/17 12/12/19  [provider]    Family History Family History  Problem Relation Age of Onset  . Healthy Mother   . Healthy Father     Social History Social History   Tobacco Use  . Smoking status: Current Every Day Smoker    Types: Cigarettes  . Smokeless tobacco: Never Used  Vaping Use  . Vaping Use: Never used  Substance Use Topics  . Alcohol use: Yes    Comment: occasionally  . Drug use: No     Allergies   Hydrocodone-acetaminophen   Review of Systems Review of Systems  Genitourinary: Positive for penile discharge.  All other systems  reviewed and are negative.    Physical Exam Triage Vital Signs ED Triage Vitals  Enc Vitals Group     BP 11/17/20 1447 129/63     Pulse Rate 11/17/20 1447 81     Resp 11/17/20 1447 18     Temp 11/17/20 1447 97.9 F (36.6 C)     Temp Source 11/17/20 1447 Oral     SpO2 11/17/20 1447 99 %     Weight --      Height --      Head Circumference --      Peak Flow --      Pain Score 11/17/20 1449 0     Pain Loc --      Pain Edu? --      Excl. in GC? --    No data found.  Updated Vital Signs BP 129/63 (BP Location: Right  Arm)   Pulse 81   Temp 97.9 F (36.6 C) (Oral)   Resp 18   SpO2 99%   Visual Acuity Right Eye Distance:   Left Eye Distance:   Bilateral Distance:    Right Eye Near:   Left Eye Near:    Bilateral Near:     Physical Exam Vitals reviewed.  Constitutional:      General: He is not in acute distress.    Appearance: Normal appearance. He is not ill-appearing.  HENT:     Head: Normocephalic and atraumatic.  Cardiovascular:     Rate and Rhythm: Normal rate and regular rhythm.     Heart sounds: Normal heart sounds.  Pulmonary:     Effort: Pulmonary effort is normal.     Breath sounds: Normal breath sounds. No wheezing, rhonchi or rales.  Abdominal:     General: Bowel sounds are normal. There is no distension.     Palpations: Abdomen is soft. There is no mass.     Tenderness: There is no abdominal tenderness. There is no right CVA tenderness, left CVA tenderness, guarding or rebound.     Comments: Bowel sounds positive in all 4 quadrants. No tenderness to palpation. Negative Murphy Sign, Rovsing's sign, McBurney point tenderness.   Neurological:     General: No focal deficit present.     Mental Status: He is alert and oriented to person, place, and time.  Psychiatric:        Mood and Affect: Mood normal.        Behavior: Behavior normal.      UC Treatments / Results  Labs (all labs ordered are listed, but only abnormal results are displayed) Labs Reviewed  CYTOLOGY, (ORAL, ANAL, URETHRAL) ANCILLARY ONLY    EKG   Radiology No results found.  Procedures Procedures (including critical care time)  Medications Ordered in UC Medications  cefTRIAXone (ROCEPHIN) injection 500 mg (has no administration in time range)    Initial Impression / Assessment and Plan / UC Course  I have reviewed the triage vital signs and the nursing notes.  Pertinent labs & imaging results that were available during my care of the patient were reviewed by me and considered in my medical  decision making (see chart for details).     This patient declines evaluation or treatment for his back and ankle pain today. rec f/u with ortho, given the chronic nature of these issues.    For penile discharge, Rocephin administered today. Doxycycline sent as below. Patient declines treatment for trichomonas today as he will be drinking alcohol. We will send over treatment pending  result of his swab.   We have sent testing for sexually transmitted infections. We will notify you of any positive results once they are received. If required, we will prescribe any additional medications you might need.   Please refrain from all sexual activity for at least the next seven days.  Seek additional medical attention if you develop fevers/chills, new/worsening abdominal pain, new/worsening vaginal discomfort/discharge, etc. Patient verbalizes understanding and agreement.  declines HIV and syphillis testing today.    This patient became angry and verbally abusive during this visit. He insists he must receive antibiotics and leave immediately. Script sent, and patient has been discharged.   Final Clinical Impressions(s) / UC Diagnoses   Final diagnoses:  STD exposure     Discharge Instructions     -Take doxycycline twice daily for 7 days. This will treat you for chlamydia.  -We've administered ceftriaxone today. This is the treatment for gonorrhea.  -Abstain from sex for the next 7 days until treatment is complete.  -We'll call you if the results of your labwork is abnormal, and we can send additional treatment at that time    ED Prescriptions    Medication Sig Dispense Auth. Provider   doxycycline (VIBRAMYCIN) 100 MG capsule Take 1 capsule (100 mg total) by mouth 2 (two) times daily. 20 capsule Rhys Martini, PA-C     PDMP not reviewed this encounter.   Rhys Martini, PA-C 11/17/20 1549    Rhys Martini, PA-C 11/17/20 1550

## 2020-11-17 NOTE — Discharge Instructions (Signed)
-  Take doxycycline twice daily for 7 days. This will treat you for chlamydia.  -We've administered ceftriaxone today. This is the treatment for gonorrhea.  -Abstain from sex for the next 7 days until treatment is complete.  -We'll call you if the results of your labwork is abnormal, and we can send additional treatment at that time

## 2020-11-19 LAB — CYTOLOGY, (ORAL, ANAL, URETHRAL) ANCILLARY ONLY
Chlamydia: NEGATIVE
Comment: NEGATIVE
Comment: NEGATIVE
Comment: NORMAL
Neisseria Gonorrhea: POSITIVE — AB
Trichomonas: NEGATIVE

## 2021-01-19 IMAGING — DX LEFT ANKLE COMPLETE - 3+ VIEW
3 series · 3 of 3 positions shown · non-contrast
Comparison: None.

CLINICAL DATA: 38-year-old male with 4 months of left ankle pain.
Swelling. Severe pain with weight-bearing. Prior surgery.

EXAM:
LEFT ANKLE COMPLETE - 3+ VIEW

[ankle ap]
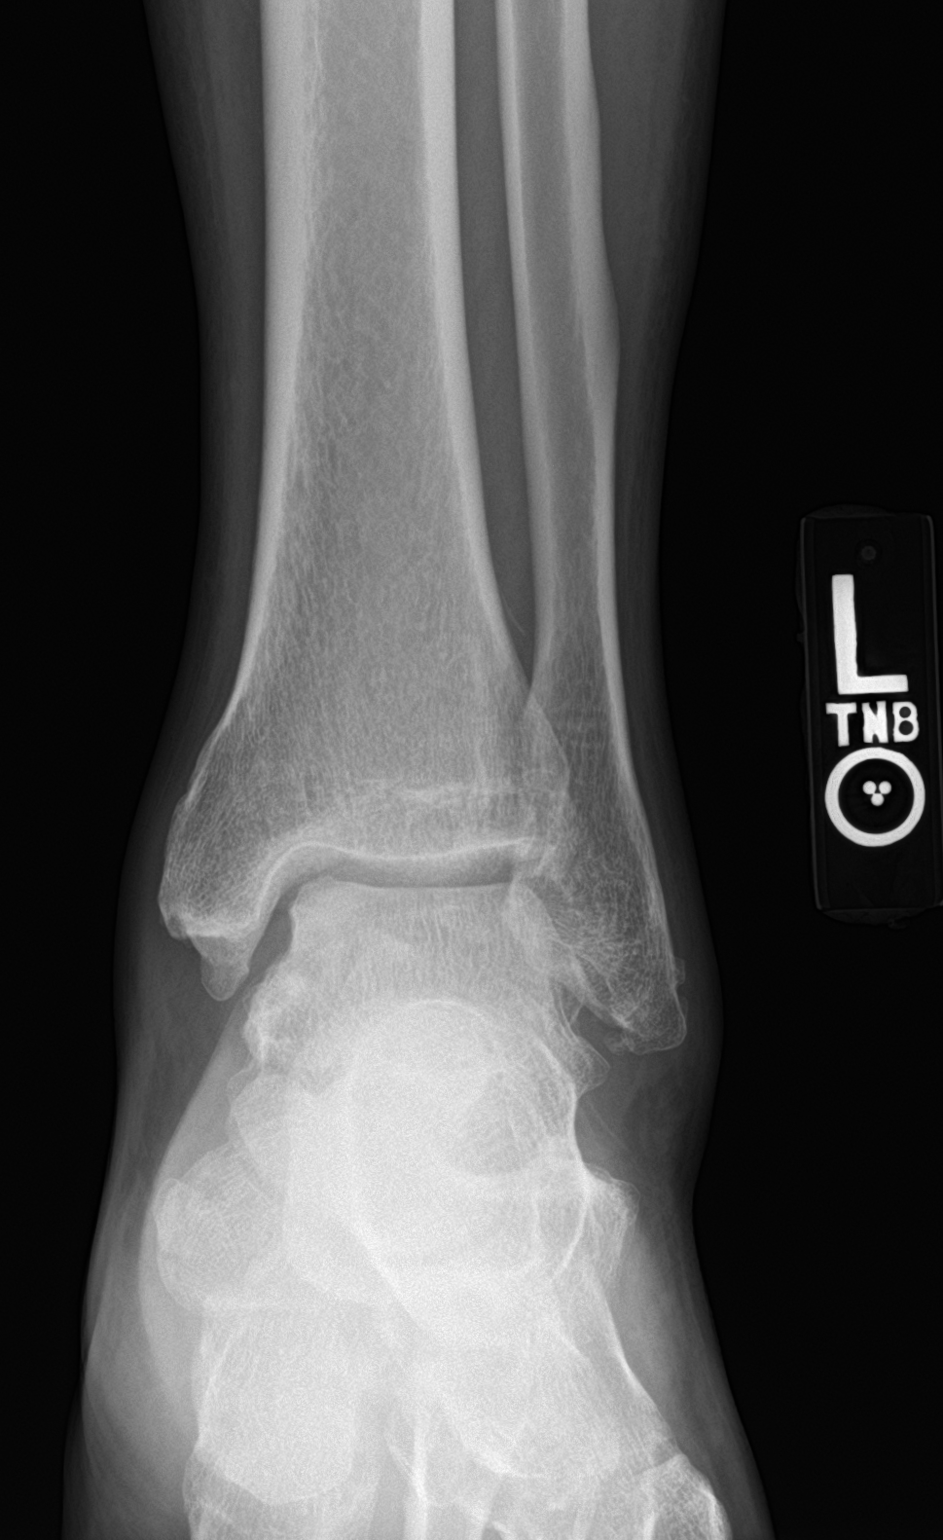

[ankle obl]
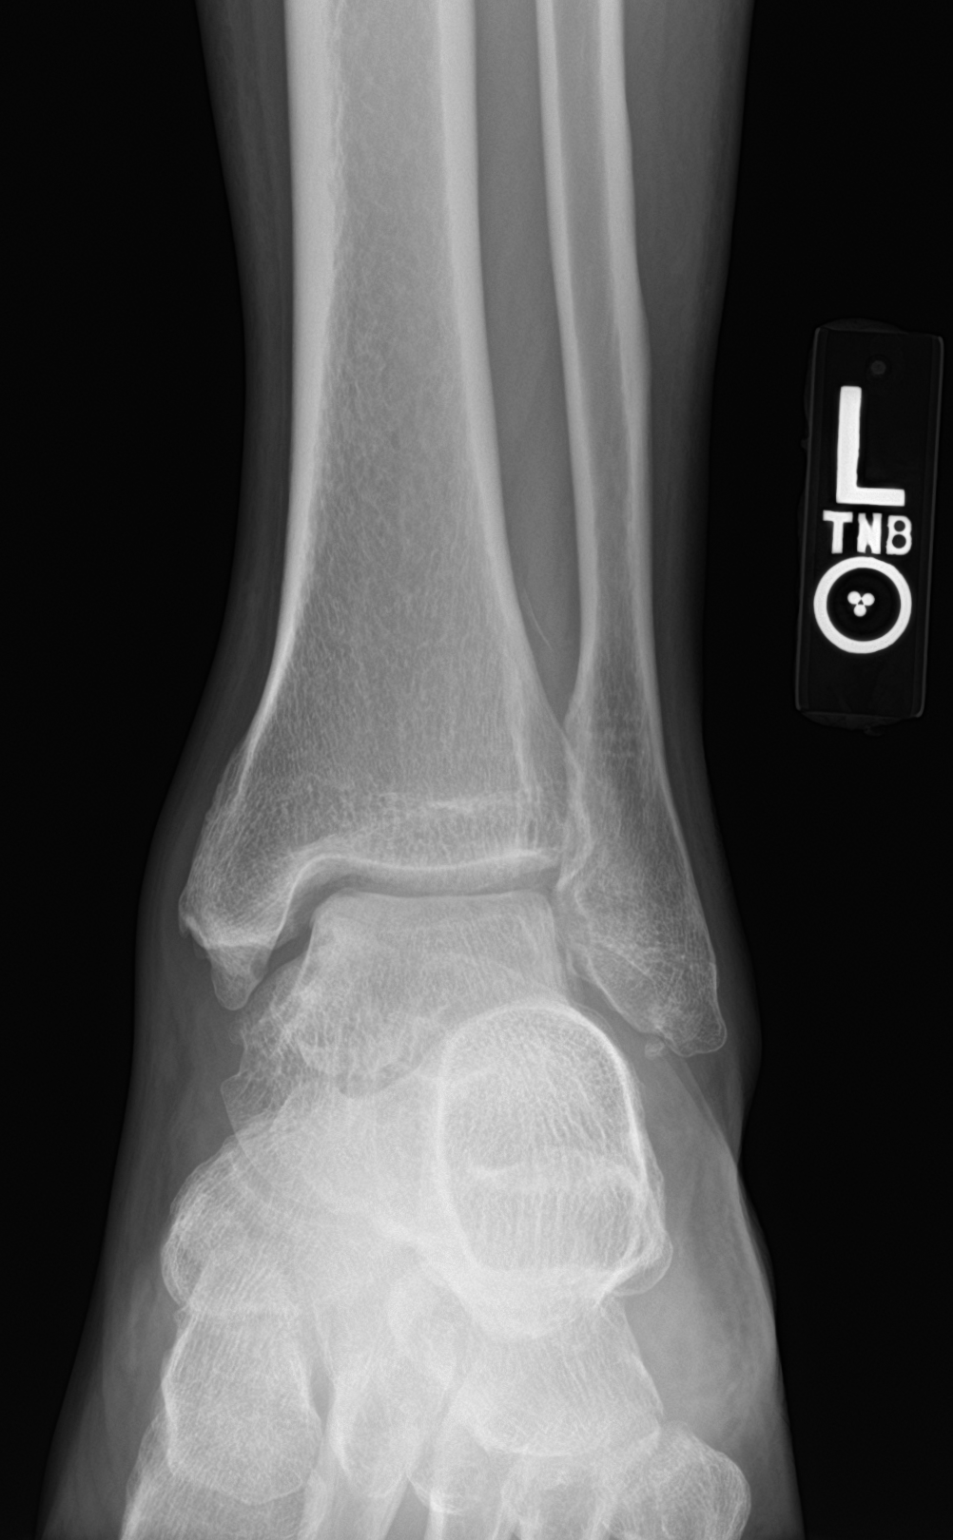

[ankle lat]
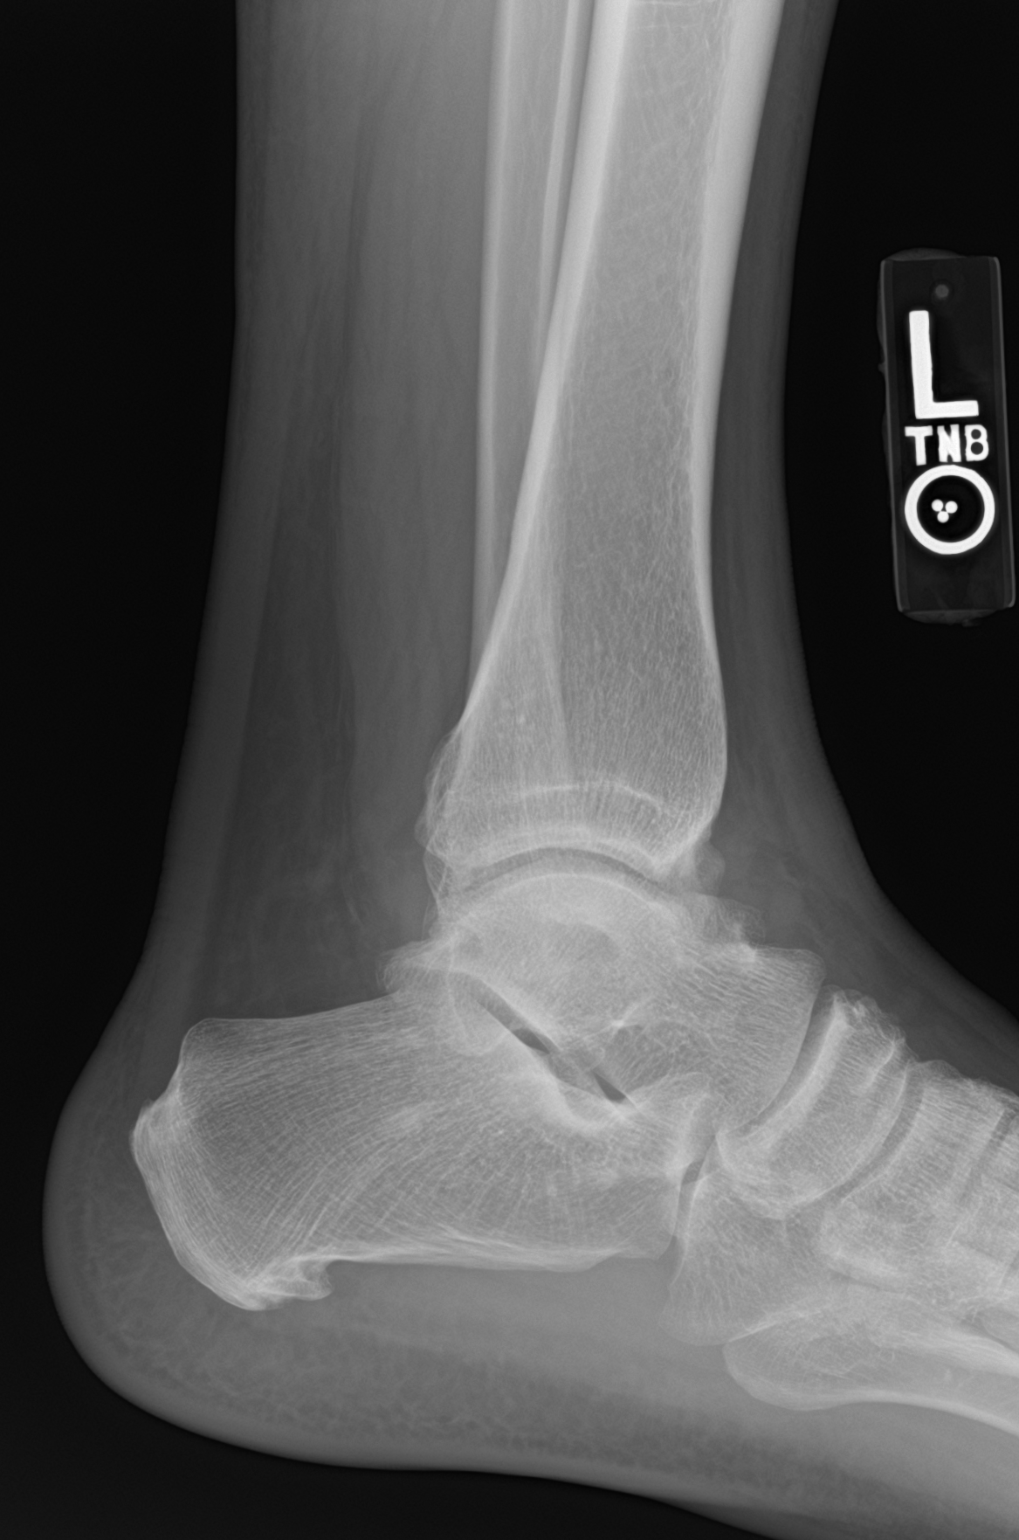

[3 of 3 positions shown; findings below may reference images not displayed]

FINDINGS: Degenerative changes throughout the left ankle including chronic
appearing degenerative spurring and 1 or more small chronic
appearing lateral malleolus ossific fragments. There is mild
heterogeneity at the medial aspect of the talar dome. Possible small
joint effusion. No fracture or dislocation. There is also mild
calcaneus degenerative spurring.
IMPRESSION: Advanced posttraumatic and/or degenerative changes at the left
ankle. Small osteochondral defect suspected at the medial talar
dome. Possible small joint effusion. No acute osseous abnormality
identified.

## 2021-04-21 ENCOUNTER — Emergency Department (HOSPITAL_COMMUNITY): Payer: No Typology Code available for payment source

## 2021-04-21 ENCOUNTER — Emergency Department (HOSPITAL_COMMUNITY)
Admission: EM | Admit: 2021-04-21 | Discharge: 2021-04-22 | Disposition: A | Discharge status: Home / Self Care | Payer: No Typology Code available for payment source | Attending: Emergency Medicine | Admitting: Emergency Medicine

## 2021-04-21 DIAGNOSIS — S61227A Laceration with foreign body of left little finger without damage to nail, initial encounter: Secondary | ICD-10-CM | POA: Insufficient documentation

## 2021-04-21 DIAGNOSIS — S61219A Laceration without foreign body of unspecified finger without damage to nail, initial encounter: Secondary | ICD-10-CM

## 2021-04-21 DIAGNOSIS — Y92410 Unspecified street and highway as the place of occurrence of the external cause: Secondary | ICD-10-CM | POA: Diagnosis not present

## 2021-04-21 DIAGNOSIS — S02832B Fracture of medial orbital wall, left side, initial encounter for open fracture: Secondary | ICD-10-CM | POA: Insufficient documentation

## 2021-04-21 DIAGNOSIS — S61225A Laceration with foreign body of left ring finger without damage to nail, initial encounter: Secondary | ICD-10-CM | POA: Diagnosis not present

## 2021-04-21 DIAGNOSIS — R410 Disorientation, unspecified: Secondary | ICD-10-CM | POA: Insufficient documentation

## 2021-04-21 DIAGNOSIS — S50819A Abrasion of unspecified forearm, initial encounter: Secondary | ICD-10-CM | POA: Insufficient documentation

## 2021-04-21 DIAGNOSIS — Z23 Encounter for immunization: Secondary | ICD-10-CM | POA: Diagnosis not present

## 2021-04-21 DIAGNOSIS — R52 Pain, unspecified: Secondary | ICD-10-CM

## 2021-04-21 DIAGNOSIS — S0990XA Unspecified injury of head, initial encounter: Secondary | ICD-10-CM | POA: Diagnosis present

## 2021-04-21 DIAGNOSIS — R109 Unspecified abdominal pain: Secondary | ICD-10-CM | POA: Insufficient documentation

## 2021-04-21 DIAGNOSIS — Z20822 Contact with and (suspected) exposure to covid-19: Secondary | ICD-10-CM | POA: Insufficient documentation

## 2021-04-21 DIAGNOSIS — Y900 Blood alcohol level of less than 20 mg/100 ml: Secondary | ICD-10-CM | POA: Insufficient documentation

## 2021-04-21 DIAGNOSIS — T1490XA Injury, unspecified, initial encounter: Secondary | ICD-10-CM

## 2021-04-21 DIAGNOSIS — S80812A Abrasion, left lower leg, initial encounter: Secondary | ICD-10-CM | POA: Insufficient documentation

## 2021-04-21 DIAGNOSIS — S0285XB Fracture of orbit, unspecified, initial encounter for open fracture: Secondary | ICD-10-CM

## 2021-04-21 DIAGNOSIS — S60512A Abrasion of left hand, initial encounter: Secondary | ICD-10-CM | POA: Diagnosis not present

## 2021-04-21 LAB — COMPREHENSIVE METABOLIC PANEL
ALT: 32 U/L (ref 0–44)
AST: 32 U/L (ref 15–41)
Albumin: 3.5 g/dL (ref 3.5–5.0)
Alkaline Phosphatase: 55 U/L (ref 38–126)
Anion gap: 7 (ref 5–15)
BUN: 7 mg/dL (ref 6–20)
CO2: 25 mmol/L (ref 22–32)
Calcium: 8.6 mg/dL — ABNORMAL LOW (ref 8.9–10.3)
Chloride: 104 mmol/L (ref 98–111)
Creatinine, Ser: 1.36 mg/dL — ABNORMAL HIGH (ref 0.61–1.24)
GFR, Estimated: 35 mL/min — ABNORMAL LOW (ref >60)
Glucose, Bld: 136 mg/dL — ABNORMAL HIGH (ref 70–99)
Potassium: 4.2 mmol/L (ref 3.5–5.1)
Sodium: 136 mmol/L (ref 135–145)
Total Bilirubin: 0.8 mg/dL (ref 0.3–1.2)
Total Protein: 6.6 g/dL (ref 6.5–8.1)

## 2021-04-21 LAB — URINALYSIS, ROUTINE W REFLEX MICROSCOPIC
Bilirubin Urine: NEGATIVE
Glucose, UA: NEGATIVE mg/dL
Ketones, ur: NEGATIVE mg/dL
Leukocytes,Ua: NEGATIVE
Nitrite: NEGATIVE
Protein, ur: NEGATIVE mg/dL
Specific Gravity, Urine: 1.015 (ref 1.005–1.030)
pH: 6 (ref 5.0–8.0)

## 2021-04-21 LAB — RESP PANEL BY RT-PCR (FLU A&B, COVID) ARPGX2
Influenza A by PCR: NEGATIVE
Influenza B by PCR: NEGATIVE
SARS Coronavirus 2 by RT PCR: NEGATIVE

## 2021-04-21 LAB — CBC
HCT: 48.9 % (ref 39.0–52.0)
Hemoglobin: 15.9 g/dL (ref 13.0–17.0)
MCH: 28.8 pg (ref 26.0–34.0)
MCHC: 32.5 g/dL (ref 30.0–36.0)
MCV: 88.4 fL (ref 80.0–100.0)
Platelets: 339 10*3/uL (ref 150–400)
RBC: 5.53 MIL/uL (ref 4.22–5.81)
RDW: 12.3 % (ref 11.5–15.5)
WBC: 8 10*3/uL (ref 4.0–10.5)
nRBC: 0 % (ref 0.0–0.2)

## 2021-04-21 LAB — SAMPLE TO BLOOD BANK

## 2021-04-21 LAB — PROTIME-INR
INR: 0.9 (ref 0.8–1.2)
Prothrombin Time: 12.3 seconds (ref 11.4–15.2)

## 2021-04-21 LAB — URINALYSIS, MICROSCOPIC (REFLEX): Bacteria, UA: NONE SEEN

## 2021-04-21 LAB — ETHANOL: Alcohol, Ethyl (B): <10 mg/dL (ref <10)

## 2021-04-21 LAB — LACTIC ACID, PLASMA: Lactic Acid, Venous: 2.3 mmol/L (ref 0.5–1.9)

## 2021-04-21 MED ORDER — CYCLOBENZAPRINE HCL 10 MG PO TABS: 10.0000 mg | ORAL_TABLET | Freq: Two times a day (BID) | ORAL | 0 refills | Status: AC | PRN

## 2021-04-21 MED ORDER — OXYCODONE-ACETAMINOPHEN 5-325 MG PO TABS: 1.0000 | ORAL_TABLET | Freq: Four times a day (QID) | ORAL | 0 refills | Status: AC | PRN

## 2021-04-21 MED ORDER — CEPHALEXIN 500 MG PO CAPS: 500.0000 mg | ORAL_CAPSULE | Freq: Two times a day (BID) | ORAL | 0 refills | Status: AC

## 2021-04-21 MED ORDER — SODIUM CHLORIDE 0.9 % IV BOLUS
1000.0000 mL | Freq: Once | INTRAVENOUS | Status: AC
Start: 2021-04-21 — End: 2021-04-21
  Administered 2021-04-21: 1000 mL via INTRAVENOUS

## 2021-04-21 MED ORDER — HYDROMORPHONE HCL 1 MG/ML IJ SOLN
0.5000 mg | Freq: Once | INTRAMUSCULAR | Status: AC
  Administered 2021-04-21: 1 mg via INTRAVENOUS
  Filled 2021-04-21: qty 1

## 2021-04-21 MED ORDER — TETANUS-DIPHTH-ACELL PERTUSSIS 5-2.5-18.5 LF-MCG/0.5 IM SUSY
0.5000 mL | PREFILLED_SYRINGE | Freq: Once | INTRAMUSCULAR | Status: AC
  Administered 2021-04-21: 0.5 mL via INTRAMUSCULAR
  Filled 2021-04-21: qty 0.5

## 2021-04-21 MED ORDER — MORPHINE SULFATE (PF) 4 MG/ML IV SOLN
4.0000 mg | Freq: Once | INTRAVENOUS | Status: AC
  Administered 2021-04-21: 4 mg via INTRAVENOUS
  Filled 2021-04-21: qty 1

## 2021-04-21 MED ORDER — IBUPROFEN 800 MG PO TABS: 800.0000 mg | ORAL_TABLET | Freq: Three times a day (TID) | ORAL | 0 refills | Status: AC

## 2021-04-21 MED ORDER — IOHEXOL 300 MG/ML  SOLN
100.0000 mL | Freq: Once | INTRAMUSCULAR | Status: AC | PRN
  Administered 2021-04-21: 100 mL via INTRAVENOUS

## 2021-04-21 MED ORDER — KETOROLAC TROMETHAMINE 15 MG/ML IJ SOLN
15.0000 mg | Freq: Once | INTRAMUSCULAR | Status: AC
  Administered 2021-04-21: 15 mg via INTRAVENOUS
  Filled 2021-04-21: qty 1

## 2021-04-21 MED ORDER — CEFAZOLIN SODIUM-DEXTROSE 2-4 GM/100ML-% IV SOLN
2.0000 g | Freq: Once | INTRAVENOUS | Status: AC
  Administered 2021-04-21: 2 g via INTRAVENOUS
  Filled 2021-04-21: qty 100

## 2021-04-21 MED ORDER — ONDANSETRON HCL 4 MG/2ML IJ SOLN
4.0000 mg | Freq: Once | INTRAMUSCULAR | Status: AC
  Administered 2021-04-21: 4 mg via INTRAVENOUS
  Filled 2021-04-21: qty 2

## 2021-04-21 MED ORDER — OXYCODONE-ACETAMINOPHEN 5-325 MG PO TABS
2.0000 | ORAL_TABLET | Freq: Once | ORAL | Status: DC
  Filled 2021-04-21: qty 2

## 2021-04-21 MED ORDER — OXYCODONE-ACETAMINOPHEN 5-325 MG PO TABS
2.0000 | ORAL_TABLET | Freq: Once | ORAL | Status: AC
  Administered 2021-04-21: 2 via ORAL

## 2021-04-21 MED ORDER — LIDOCAINE HCL (PF) 1 % IJ SOLN
5.0000 mL | Freq: Once | INTRAMUSCULAR | Status: AC
  Administered 2021-04-21: 5 mL via INTRADERMAL
  Filled 2021-04-21: qty 5

## 2021-04-21 MED ORDER — SODIUM CHLORIDE 0.9 % IV SOLN: INTRAVENOUS | Status: DC

## 2021-04-21 MED ORDER — AFRIN NASAL SPRAY 0.05 % NA SOLN: 1.0000 | Freq: Two times a day (BID) | NASAL | 0 refills | Status: DC | PRN

## 2021-04-21 MED ORDER — CYCLOBENZAPRINE HCL 10 MG PO TABS
10.0000 mg | ORAL_TABLET | Freq: Once | ORAL | Status: AC
  Administered 2021-04-21: 10 mg via ORAL
  Filled 2021-04-21: qty 1

## 2021-04-21 NOTE — ED Notes (Signed)
Cleaned abrasion on left forearm and applied bacitracin and non stick dressing.  EDP notified of pain
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
## 2021-04-21 NOTE — Discharge Instructions (Addendum)
I have written several prescriptions that you will need to pick up from the pharmacy tonight. I sent them to a 24 hours pharmacy.  You need to call and schedule appointments for several specialist.  I included their contact information in this handout. Left hand injury: You will need to call the hand surgeon to schedule an appointment within the next week.  Keep the wounds clean and dry. Orbital bone fracture: You will need to call both the ENT doctor and ophthalmologist to be evaluated.  If you have nasal congestion, do not blow your nose.  I have prescribed you Afrin which is a medication that can help with congestion. Left knee injury: Recommend keeping the knee brace on anytime you are moving around.  Please do the RICE therapy as included on this handout.
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
## 2021-04-22 NOTE — ED Notes (Signed)
Spoke with EDP regarding left leg lac.  He advised to apply bacitracin and non stick dressing which I did.  Ortho tech applied ace wrap and splint per EDP instruction

## 2021-04-22 NOTE — Progress Notes (Addendum)
Orthopedic Tech Progress Note Patient Details:  Jeffery Santos [birth year] [id number]  Ortho Devices Type of Ortho Device: Volar splint, Crutches, Ace wrap Ortho Device/Splint Location: lue. lle. Ortho Device/Splint Interventions: Ordered, Application, Adjustment  I applied a long volar after asking the dr did he mean for me to put on a thumb spica for a 5th finger fracture. He said he meant for a long volar to be applied over the finger splint he had already applied. I then explained that a knee sleeve wouldn't go over the knee without removing the dressing that was on the leg already and he said it would be ok to apply an ace wrap. Post Interventions Patient Tolerated: Well Instructions Provided: Care of device, Adjustment of device  Trinna Post 04/22/2021, 1:34 AM

## 2021-04-30 ENCOUNTER — Other Ambulatory Visit: Payer: Self-pay

## 2021-04-30 ENCOUNTER — Encounter (HOSPITAL_COMMUNITY): Payer: Self-pay | Admitting: Orthopedic Surgery
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
## 2021-04-30 NOTE — Progress Notes (Signed)
PCP - None Cardiologist - None EKG - n/a Chest x-ray - n/a ECHO - denies Cardiac Cath - denies CPAP - denies  Fasting Blood Sugar:  n/a Checks Blood Sugar:  n/a  Blood Thinner Instructions: n/a Aspirin Instructions: n/a  ERAS Protcol - None Awaiting orders from MD--per anesthesia protocol, NPO after Midnight, clears until 14:00  COVID TEST- n/a: ambulatory surgery  Anesthesia review: NO  -------------  SDW INSTRUCTIONS:  Your procedure is scheduled on June 29. Please report to [facility] Main Entrance "A" at 2:30 PM, and check in at the Admitting office. Call this number if you have problems the morning of surgery: [phone]   Remember: Do not eat after midnight the night before your surgery  You may drink clear liquids until 2:00 the afternoon of your surgery.   Clear liquids allowed are: Water, Non-Citrus Juices (without pulp), Carbonated Beverages, Clear Tea, Black Coffee Only, and Gatorade   Medications to take morning of surgery with a sip of water include: If needed: Tylenol Nasal sprays  As of today, STOP taking any Aspirin (unless otherwise instructed by your surgeon), Aleve, Naproxen, Ibuprofen, Motrin, Advil, Goody's, BC's, all herbal medications, fish oil, and all vitamins.    The Morning of Surgery Do not wear jewelry, make-up or nail polish. Do not wear lotions, powders, or perfumes/colognes, or deodorant Do not shave 48 hours prior to surgery.   Men may shave face and neck. Do not bring valuables to the hospital. [facility] is not responsible for any belongings or valuables.  If you are a smoker, DO NOT Smoke 24 hours prior to surgery If you wear a CPAP at night please bring your mask the morning of surgery  Remember that you must have someone to transport you home after your surgery, and remain with you for 24 hours if you are discharged the same day.  Please bring cases for contacts, glasses, hearing aids, dentures or bridgework because it  cannot be worn into surgery.   Patients discharged the day of surgery will not be allowed to drive home.   Please shower the NIGHT BEFORE/MORNING OF SURGERY (use antibacterial soap like DIAL soap if possible). Wear comfortable clothes the morning of surgery. Oral Hygiene is also important to reduce your risk of infection.  Remember - BRUSH YOUR TEETH THE MORNING OF SURGERY WITH YOUR REGULAR TOOTHPASTE  Patient denies shortness of breath, fever, cough and chest pain.

## 2021-05-01 ENCOUNTER — Other Ambulatory Visit: Payer: Self-pay

## 2021-05-01 ENCOUNTER — Encounter (HOSPITAL_COMMUNITY)
Admission: RE | Disposition: A | Discharge status: Home / Self Care | Payer: Self-pay | Source: Home / Self Care | Attending: Orthopedic Surgery

## 2021-05-01 ENCOUNTER — Ambulatory Visit (HOSPITAL_COMMUNITY): Payer: 59 | Admitting: Anesthesiology

## 2021-05-01 ENCOUNTER — Encounter (HOSPITAL_COMMUNITY): Payer: Self-pay | Admitting: Orthopedic Surgery

## 2021-05-01 ENCOUNTER — Ambulatory Visit (HOSPITAL_COMMUNITY)
Admission: RE | Admit: 2021-05-01 | Discharge: 2021-05-01 | Disposition: A | Discharge status: Home / Self Care | Payer: 59 | Attending: Orthopedic Surgery | Admitting: Orthopedic Surgery

## 2021-05-01 DIAGNOSIS — Z79899 Other long term (current) drug therapy: Secondary | ICD-10-CM | POA: Insufficient documentation

## 2021-05-01 DIAGNOSIS — S62617B Displaced fracture of proximal phalanx of left little finger, initial encounter for open fracture: Secondary | ICD-10-CM | POA: Diagnosis not present

## 2021-05-01 DIAGNOSIS — Z885 Allergy status to narcotic agent status: Secondary | ICD-10-CM | POA: Diagnosis not present

## 2021-05-01 DIAGNOSIS — S66321S Laceration of extensor muscle, fascia and tendon of left index finger at wrist and hand level, sequela: Secondary | ICD-10-CM

## 2021-05-01 DIAGNOSIS — S61215A Laceration without foreign body of left ring finger without damage to nail, initial encounter: Secondary | ICD-10-CM | POA: Diagnosis present

## 2021-05-01 DIAGNOSIS — F17210 Nicotine dependence, cigarettes, uncomplicated: Secondary | ICD-10-CM | POA: Diagnosis not present

## 2021-05-01 HISTORY — PX: WOUND EXPLORATION: SHX6188

## 2021-05-01 SURGERY — WOUND EXPLORATION
Anesthesia: General | Site: Hand | Laterality: Left

## 2021-05-01 MED ORDER — BUPIVACAINE HCL (PF) 0.5 % IJ SOLN
INTRAMUSCULAR | Status: DC | PRN
  Administered 2021-05-01: 5 mL

## 2021-05-01 MED ORDER — BUPIVACAINE HCL (PF) 0.5 % IJ SOLN
INTRAMUSCULAR | Status: AC
  Filled 2021-05-01: qty 30

## 2021-05-01 MED ORDER — FENTANYL CITRATE (PF) 100 MCG/2ML IJ SOLN
25.0000 ug | INTRAMUSCULAR | Status: DC | PRN
Start: 2021-05-01 — End: 2021-05-02
  Administered 2021-05-01: 50 ug via INTRAVENOUS

## 2021-05-01 MED ORDER — ACETAMINOPHEN 10 MG/ML IV SOLN: 1000.0000 mg | Freq: Once | INTRAVENOUS | Status: DC | PRN

## 2021-05-01 MED ORDER — CEFAZOLIN SODIUM-DEXTROSE 2-4 GM/100ML-% IV SOLN
INTRAVENOUS | Status: AC
  Filled 2021-05-01: qty 100

## 2021-05-01 MED ORDER — PHENYLEPHRINE HCL (PRESSORS) 10 MG/ML IV SOLN
INTRAVENOUS | Status: DC | PRN
  Administered 2021-05-01 (×2): 80 ug via INTRAVENOUS
  Administered 2021-05-01: 120 ug via INTRAVENOUS

## 2021-05-01 MED ORDER — KETOROLAC TROMETHAMINE 30 MG/ML IJ SOLN
30.0000 mg | Freq: Once | INTRAMUSCULAR | Status: AC
  Administered 2021-05-01: 30 mg via INTRAVENOUS

## 2021-05-01 MED ORDER — PROPOFOL 10 MG/ML IV BOLUS
INTRAVENOUS | Status: DC | PRN
  Administered 2021-05-01: 200 mg via INTRAVENOUS

## 2021-05-01 MED ORDER — CHLORHEXIDINE GLUCONATE 0.12 % MT SOLN
15.0000 mL | OROMUCOSAL | Status: AC
  Administered 2021-05-01: 15 mL via OROMUCOSAL
  Filled 2021-05-01 (×2): qty 15

## 2021-05-01 MED ORDER — LACTATED RINGERS IV SOLN: INTRAVENOUS | Status: DC

## 2021-05-01 MED ORDER — FENTANYL CITRATE (PF) 250 MCG/5ML IJ SOLN
INTRAMUSCULAR | Status: DC | PRN
  Administered 2021-05-01: 50 ug via INTRAVENOUS

## 2021-05-01 MED ORDER — MIDAZOLAM HCL 2 MG/2ML IJ SOLN
INTRAMUSCULAR | Status: DC | PRN
  Administered 2021-05-01: 2 mg via INTRAVENOUS

## 2021-05-01 MED ORDER — SODIUM CHLORIDE 0.9 % IV SOLN
INTRAVENOUS | Status: DC | PRN
  Administered 2021-05-01: 50 ug/min via INTRAVENOUS

## 2021-05-01 MED ORDER — FENTANYL CITRATE (PF) 250 MCG/5ML IJ SOLN
INTRAMUSCULAR | Status: AC
  Filled 2021-05-01: qty 5

## 2021-05-01 MED ORDER — BUPIVACAINE-EPINEPHRINE (PF) 0.25% -1:200000 IJ SOLN
INTRAMUSCULAR | Status: AC
  Filled 2021-05-01: qty 30

## 2021-05-01 MED ORDER — MIDAZOLAM HCL 2 MG/2ML IJ SOLN
INTRAMUSCULAR | Status: AC
  Filled 2021-05-01: qty 2

## 2021-05-01 MED ORDER — DEXAMETHASONE SODIUM PHOSPHATE 10 MG/ML IJ SOLN
INTRAMUSCULAR | Status: DC | PRN
  Administered 2021-05-01: 5 mg via INTRAVENOUS

## 2021-05-01 MED ORDER — FENTANYL CITRATE (PF) 100 MCG/2ML IJ SOLN
INTRAMUSCULAR | Status: AC
  Filled 2021-05-01: qty 2

## 2021-05-01 MED ORDER — LIDOCAINE 2% (20 MG/ML) 5 ML SYRINGE
INTRAMUSCULAR | Status: DC | PRN
  Administered 2021-05-01: 60 mg via INTRAVENOUS

## 2021-05-01 MED ORDER — PROMETHAZINE HCL 25 MG/ML IJ SOLN: 6.2500 mg | INTRAMUSCULAR | Status: DC | PRN

## 2021-05-01 MED ORDER — KETOROLAC TROMETHAMINE 30 MG/ML IJ SOLN
INTRAMUSCULAR | Status: AC
  Filled 2021-05-01: qty 1

## 2021-05-01 MED ORDER — AMISULPRIDE (ANTIEMETIC) 5 MG/2ML IV SOLN
INTRAVENOUS | Status: AC
  Filled 2021-05-01: qty 4

## 2021-05-01 MED ORDER — OXYCODONE-ACETAMINOPHEN 10-325 MG PO TABS: 1.0000 | ORAL_TABLET | Freq: Four times a day (QID) | ORAL | 0 refills | Status: DC | PRN

## 2021-05-01 MED ORDER — CEFAZOLIN SODIUM-DEXTROSE 2-3 GM-%(50ML) IV SOLR
INTRAVENOUS | Status: DC | PRN
  Administered 2021-05-01: 2 g via INTRAVENOUS

## 2021-05-01 MED ORDER — EPHEDRINE SULFATE 50 MG/ML IJ SOLN
INTRAMUSCULAR | Status: DC | PRN
  Administered 2021-05-01 (×2): 5 mg via INTRAVENOUS

## 2021-05-01 MED ORDER — PROPOFOL 10 MG/ML IV BOLUS
INTRAVENOUS | Status: AC
  Filled 2021-05-01: qty 40

## 2021-05-01 MED ORDER — OXYCODONE HCL 5 MG/5ML PO SOLN
5.0000 mg | Freq: Once | ORAL | Status: DC | PRN
Start: 2021-05-01 — End: 2021-05-02

## 2021-05-01 MED ORDER — 0.9 % SODIUM CHLORIDE (POUR BTL) OPTIME
TOPICAL | Status: DC | PRN
  Administered 2021-05-01: 1000 mL

## 2021-05-01 MED ORDER — OXYCODONE HCL 5 MG PO TABS: 5.0000 mg | ORAL_TABLET | Freq: Once | ORAL | Status: DC | PRN

## 2021-05-01 MED ORDER — AMISULPRIDE (ANTIEMETIC) 5 MG/2ML IV SOLN
10.0000 mg | Freq: Once | INTRAVENOUS | Status: AC | PRN
  Administered 2021-05-01: 10 mg via INTRAVENOUS

## 2021-05-01 SURGICAL SUPPLY — 24 items
ANCH SUT 2-0 MN NDL DRL PLSTR (Anchor) ×2 IMPLANT
ANCHOR JUGGERKNOT 1.0 1DR 2-0 (Anchor) ×4 IMPLANT
BAG COUNTER SPONGE SURGICOUNT (BAG) ×2 IMPLANT
BAG SPNG CNTER NS LX DISP (BAG) ×1
BAG SURGICOUNT SPONGE COUNTING (BAG) ×1
BNDG COHESIVE 1X5 TAN STRL LF (GAUZE/BANDAGES/DRESSINGS) ×2 IMPLANT
CUFF TOURN SGL QUICK 18X4 (TOURNIQUET CUFF) ×3 IMPLANT
DRAPE SURG 17X11 SM STRL (DRAPES) ×6 IMPLANT
DRSG EMULSION OIL 3X3 NADH (GAUZE/BANDAGES/DRESSINGS) ×3 IMPLANT
ELECT REM PT RETURN 9FT ADLT (ELECTROSURGICAL) ×3
ELECTRODE REM PT RTRN 9FT ADLT (ELECTROSURGICAL) ×1 IMPLANT
GAUZE SPONGE 2X2 8PLY STRL LF (GAUZE/BANDAGES/DRESSINGS) IMPLANT
GLOVE SURG ORTHO LTX SZ8 (GLOVE) ×3 IMPLANT
GLOVE SURG UNDER POLY LF SZ8.5 (GLOVE) ×2 IMPLANT
GOWN STRL REUS W/ TWL XL LVL3 (GOWN DISPOSABLE) ×1 IMPLANT
GOWN STRL REUS W/TWL XL LVL3 (GOWN DISPOSABLE) ×3
KIT BASIN OR (CUSTOM PROCEDURE TRAY) ×3 IMPLANT
PACK ORTHO EXTREMITY (CUSTOM PROCEDURE TRAY) ×3 IMPLANT
SOAP 2 % CHG 4 OZ (WOUND CARE) ×3 IMPLANT
SPONGE GAUZE 2X2 STER 10/PKG (GAUZE/BANDAGES/DRESSINGS) ×2
SUT PROLENE 4 0 PS 2 18 (SUTURE) ×2 IMPLANT
SYR 20ML LL LF (SYRINGE) ×3 IMPLANT
SYR CONTROL 10ML LL (SYRINGE) ×3 IMPLANT
TOWEL GREEN STERILE (TOWEL DISPOSABLE) ×3 IMPLANT
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
## 2021-05-01 NOTE — Anesthesia Procedure Notes (Signed)
Procedure Name: LMA Insertion Date/Time: 05/01/2021 6:05 PM Performed by: Dairl Ponder, CRNA Pre-anesthesia Checklist: Patient identified, Emergency Drugs available, Suction available, Patient being monitored and Timeout performed Patient Re-evaluated:Patient Re-evaluated prior to induction Oxygen Delivery Method: Circle system utilized Preoxygenation: Pre-oxygenation with 100% oxygen Induction Type: IV induction LMA: LMA inserted LMA Size: 5.0 Number of attempts: 1 Placement Confirmation: positive ETCO2 and breath sounds checked- equal and bilateral Tube secured with: Tape Dental Injury: Teeth and Oropharynx as per pre-operative assessment

## 2021-05-01 NOTE — Progress Notes (Signed)
Okay to give 30mg  IV toradol per Dr. .

## 2021-05-01 NOTE — Anesthesia Preprocedure Evaluation (Addendum)
Anesthesia Evaluation  Patient identified by MRN, date of birth, ID band Patient awake    Reviewed: Allergy & Precautions, NPO status , Patient's Chart, lab work & pertinent test results  Airway Mallampati: II  TM Distance: >3 FB Neck ROM: Full    Dental  (+) Missing,    Pulmonary Current SmokerPatient did not abstain from smoking.,    Pulmonary exam normal breath sounds clear to auscultation       Cardiovascular negative cardio ROS Normal cardiovascular exam Rhythm:Regular Rate:Normal     Neuro/Psych negative neurological ROS  negative psych ROS   GI/Hepatic negative GI ROS, Neg liver ROS,   Endo/Other  negative endocrine ROS  Renal/GU negative Renal ROS     Musculoskeletal negative musculoskeletal ROS (+)   Abdominal (+) + obese,   Peds  Hematology negative hematology ROS (+)   Anesthesia Other Findings left small finger laceration with tendon involvement  Reproductive/Obstetrics                            Anesthesia Physical Anesthesia Plan  ASA: 2  Anesthesia Plan: General   Post-op Pain Management:    Induction: Intravenous  PONV Risk Score and Plan: 1 and Ondansetron, Dexamethasone, Midazolam and Treatment may vary due to age or medical condition  Airway Management Planned: LMA  Additional Equipment:   Intra-op Plan:   Post-operative Plan: Extubation in OR  Informed Consent: I have reviewed the patients History and Physical, chart, labs and discussed the procedure including the risks, benefits and alternatives for the proposed anesthesia with the patient or authorized representative who has indicated his/her understanding and acceptance.     Dental advisory given  Plan Discussed with: CRNA  Anesthesia Plan Comments:         Anesthesia Quick Evaluation
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
## 2021-05-01 NOTE — Discharge Instructions (Signed)
KEEP BANDAGE CLEAN AND DRY CALL OFFICE FOR F/U APPT [phone] in 2 weeks KEEP HAND ELEVATED ABOVE HEART OK TO APPLY ICE TO OPERATIVE AREA CONTACT OFFICE IF ANY WORSENING PAIN OR CONCERNS.  

## 2021-05-01 NOTE — Transfer of Care (Signed)
Immediate Anesthesia Transfer of Care Note  Patient: Jeffery Santos  Procedure(s) Performed: WOUND EXPLORATION-Left small finger wound exploration and tendon repair (Left: Hand)  Patient Location: PACU  Anesthesia Type:General  Level of Consciousness: awake, alert , oriented and patient cooperative  Airway & Oxygen Therapy: Patient Spontanous Breathing  Post-op Assessment: Report given to RN and Post -op Vital signs reviewed and stable  Post vital signs: Reviewed and stable  Last Vitals:  Vitals Value Taken Time  BP    Temp    Pulse 85 05/01/21 1909  Resp 30 05/01/21 1909  SpO2 94 05/01/21 1909  Vitals shown include unvalidated device data.  Last Pain:  Vitals:   05/01/21 1415  TempSrc:   PainSc: 3       Patients Stated Pain Goal: 0 (05/01/21 1415)  Complications: No notable events documented.

## 2021-05-02 ENCOUNTER — Encounter (HOSPITAL_COMMUNITY): Payer: Self-pay | Admitting: Orthopedic Surgery

## 2021-05-08 NOTE — Anesthesia Postprocedure Evaluation (Signed)
Anesthesia Post Note  Patient: Jeffery Santos  Procedure(s) Performed: WOUND EXPLORATION-Left small finger wound exploration and tendon repair (Left: Hand)     Patient location during evaluation: PACU Anesthesia Type: General Level of consciousness: awake and alert Pain management: pain level controlled Vital Signs Assessment: post-procedure vital signs reviewed and stable Respiratory status: spontaneous breathing, nonlabored ventilation, respiratory function stable and patient connected to nasal cannula oxygen Cardiovascular status: blood pressure returned to baseline and stable Postop Assessment: no apparent nausea or vomiting Anesthetic complications: no   No notable events documented.  Last Vitals:  Vitals:   05/01/21 1930 05/01/21 1945  BP: 128/81 131/79  Pulse: 85 81  Resp: (!) 22 16  Temp:  36.8 C  SpO2: 94% 95%    Last Pain:  Vitals:   05/01/21 1945  TempSrc:   PainSc: 4                  Geovany Trudo

## 2021-07-06 ENCOUNTER — Ambulatory Visit (HOSPITAL_COMMUNITY)
Admission: RE | Admit: 2021-07-06 | Discharge: 2021-07-06 | Disposition: A | Discharge status: Home / Self Care | Payer: 59 | Attending: Psychiatry | Admitting: Psychiatry

## 2021-07-06 DIAGNOSIS — F32A Depression, unspecified: Secondary | ICD-10-CM | POA: Diagnosis not present

## 2021-07-06 DIAGNOSIS — F988 Other specified behavioral and emotional disorders with onset usually occurring in childhood and adolescence: Secondary | ICD-10-CM

## 2021-07-06 DIAGNOSIS — F22 Delusional disorders: Secondary | ICD-10-CM | POA: Diagnosis not present

## 2021-07-06 NOTE — BH Assessment (Signed)
Patient is a 40 year old male that presents this date voluntary to [facility] requesting  resources/counseling to assist with issues that occurred in childhood. Patient states he has been "dealing with this for years" reporting that since the age of 54 he remembers having issues with his father and brother. Patient denies any S/I, H/I or AVH. Patient denies currently having a OP provider for counseling. Patient was seen by Melvyn Neth NP and evaluated. Patient did not meet inpatient criteria and per Melvyn Neth NP will be provided with area resources to assist with counseling needs.      Melvyn Neth NP writes: BOWMAN HIGBIE is a 40 y.o. male presents to [facility] accompanied by his parents.  He reports " I need help with my mental health I am breaking down and I know that stress will kill you. "  He is denying suicidal or homicidal ideations.  Denies auditory or visual hallucinations.  Reports history of trauma. Stated " when I was 40 years old I remember that the doctors told me that my father said that I am jealous of my brother" patient reports he has not been able to let that statement go.  Father [name] reported " I have apologized multiple times for what he assumed that he has heard."     Reported ongoing paranoia.  Denied that he is followed by therapy and/or psychiatry.  Reported history with attention deficit disorder and depression"  I do not believe the medications."  States he has been in prison for 9 years.  Reports ongoing depression.   Mother has concerns with patient's mood lability and temper.  Denied any safety concerns.  Denied that patient has access to guns or weapons.  Patient is requesting additional outpatient resources for therapy.  Discussed follow-up with partial hospitalization programming patient was receptive to plan. Support, encouragement and  reassurance was provided

## 2021-07-06 NOTE — H&P (Signed)
Behavioral Health Medical Screening Exam  Jeffery Santos is a 40 y.o. male presents to [facility] accompanied by his parents.  He reports " I need help with my mental health I am breaking down and I know that stress will kill you. "  He is denying suicidal or homicidal ideations.  Denies auditory or visual hallucinations.  Reports history of trauma. Stated " when I was 40 years old I remember that the doctors told me that my father said that I am jealous of my brother" patient reports he has not been able to let that statement go.  Father Jeffery Santos reported " I have apologized multiple times for what he assumed that he has heard."    Reported ongoing paranoia.  Denied that he is followed by therapy and/or psychiatry.  Reported history with attention deficit disorder and depression"  I do not believe the medications."  States he has been in prison for 9 years.  Reports ongoing depression.  Mother has concerns with patient's mood lability and temper.  Denied any safety concerns.  Denied that patient has access to guns or weapons.  Patient is requesting additional outpatient resources for therapy.  Discussed follow-up with partial hospitalization programming patient was receptive to plan. Support, encouragement and  reassurance was provided  Total Time spent with patient: 15 minutes  Psychiatric Specialty Exam:  Presentation  General Appearance: Appropriate for Environment  Eye Contact:Good  Speech:Clear and Coherent  Speech Volume:Normal  Handedness:Right   Mood and Affect  Mood:Anxious; Depressed; Irritable  Affect:Labile   Thought Process  Thought Processes:Coherent  Descriptions of Associations:Intact  Orientation:Full (Time, Place and Person)  Thought Content:Logical; Rumination  History of Schizophrenia/Schizoaffective disorder:No data recorded Duration of Psychotic Symptoms:No data recorded Hallucinations:Hallucinations: None  Ideas of Reference:None  Suicidal  Thoughts:Suicidal Thoughts: No  Homicidal Thoughts:Homicidal Thoughts: No   Sensorium  Memory:Immediate Good; Recent Good; Remote Good  Judgment:Good  Insight:Fair   Executive Functions  Concentration:Good  Attention Span:Good; Fair  Recall:Fair  Fund of Knowledge:Fair  Language:Good   Psychomotor Activity  Psychomotor Activity:Psychomotor Activity: Normal   Assets  Assets:Communication Skills; Intimacy; Social Support   Sleep  Sleep:Sleep: Poor    Physical Exam: Physical Exam ROS There were no vitals taken for this visit. There is no height or weight on file to calculate BMI.  Musculoskeletal: Strength & Muscle Tone: within normal limits Gait & Station: normal Patient leans: N/A   Recommendations: Outpatient resources was provided for partial hospitalization programming at [facility]  -Consider family therapy services  Based on my evaluation the patient does not appear to have an emergency medical condition.  Jeffery Rack, NP 07/06/2021, 4:51 PM

## 2022-03-18 ENCOUNTER — Ambulatory Visit (HOSPITAL_COMMUNITY)
Admission: EM | Admit: 2022-03-18 | Discharge: 2022-03-18 | Disposition: A | Discharge status: Home / Self Care | Payer: PRIVATE HEALTH INSURANCE | Attending: Family Medicine | Admitting: Family Medicine

## 2022-03-18 ENCOUNTER — Encounter (HOSPITAL_COMMUNITY): Payer: Self-pay

## 2022-03-18 DIAGNOSIS — L03011 Cellulitis of right finger: Secondary | ICD-10-CM

## 2022-03-18 DIAGNOSIS — G8929 Other chronic pain: Secondary | ICD-10-CM

## 2022-03-18 DIAGNOSIS — M549 Dorsalgia, unspecified: Secondary | ICD-10-CM | POA: Diagnosis not present

## 2022-03-18 MED ORDER — AMOXICILLIN-POT CLAVULANATE 875-125 MG PO TABS: 1.0000 | ORAL_TABLET | Freq: Two times a day (BID) | ORAL | 0 refills | Status: AC
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
## 2022-03-18 NOTE — ED Triage Notes (Signed)
Pt presents with right ring finger infection X 3 days. ?

## 2022-03-18 NOTE — Discharge Instructions (Addendum)
You were seen today for an infection of your finger.  ?I have sent out an antibiotic to take twice/day x 10 days.  I recommend warm soaks several times/day.  Please avoid biting your nails or putting your hands in your mouth.  ?For your back pain, I do recommend you discuss with your primary care provider.  You likely need updated xrays or MRI for your continued pain to start.  Some specialists will not see you unless this has been done prior.  However, you may try to call [facility] at [phone].  ?

## 2022-04-09 ENCOUNTER — Ambulatory Visit (HOSPITAL_COMMUNITY)
Admission: EM | Admit: 2022-04-09 | Discharge: 2022-04-09 | Disposition: A | Discharge status: Home / Self Care | Payer: PRIVATE HEALTH INSURANCE | Attending: Physician Assistant | Admitting: Physician Assistant

## 2022-04-09 ENCOUNTER — Encounter (HOSPITAL_COMMUNITY): Payer: Self-pay

## 2022-04-09 DIAGNOSIS — J40 Bronchitis, not specified as acute or chronic: Secondary | ICD-10-CM

## 2022-04-09 DIAGNOSIS — J329 Chronic sinusitis, unspecified: Secondary | ICD-10-CM

## 2022-04-09 DIAGNOSIS — R051 Acute cough: Secondary | ICD-10-CM

## 2022-04-09 MED ORDER — AMOXICILLIN-POT CLAVULANATE 875-125 MG PO TABS: 1.0000 | ORAL_TABLET | Freq: Two times a day (BID) | ORAL | 0 refills | Status: DC

## 2022-04-09 MED ORDER — CEFDINIR 300 MG PO CAPS: 300.0000 mg | ORAL_CAPSULE | Freq: Two times a day (BID) | ORAL | 0 refills | Status: DC

## 2022-04-09 MED ORDER — PROMETHAZINE-DM 6.25-15 MG/5ML PO SYRP: 5.0000 mL | ORAL_SOLUTION | Freq: Three times a day (TID) | ORAL | 0 refills | Status: DC | PRN

## 2022-04-09 NOTE — ED Triage Notes (Signed)
Pt c/o cough, nasal/chest congestion, and secretion stuck in throat.

## 2022-04-09 NOTE — Discharge Instructions (Signed)
We are treating you for a sinus infection.  Please start cefdinir twice daily for 10 days.  I have also called in a cough medicine.  This will make you sleepy so do not drive or drink alcohol with taking it.  You can use Mucinex, Flonase, Tylenol for additional symptom relief.  Make sure you rest and drink plenty of fluids.  If symptoms not improving within a week please return.  If anything worsens and you have high fever, worsening cough, shortness of breath, chest pain, nausea/vomiting, weakness you need to be seen immediately.

## 2022-04-09 NOTE — ED Provider Notes (Signed)
[facility]    CSN: [id number] Arrival date & time: 04/09/22  1032      History   Chief Complaint Chief Complaint  Patient presents with   Cough    HPI Jeffery Santos is a 41 y.o. male.   Patient presents today with a 10-day history of URI symptoms.  Reports cough, nasal congestion, sore throat, fatigue, malaise.  Denies any fever, chest pain, shortness of breath, nausea, vomiting.  Denies any known sick contacts.  He denies history of allergies, asthma, COPD, smoking.  He has tried DayQuil without improvement of symptoms.  Denies any recent steroid use.  Was treated with Augmentin 03/18/2022.  He denies any significant past medical history including diabetes.   History reviewed. No pertinent past medical history.  There are no problems to display for this patient.   Past Surgical History:  Procedure Laterality Date   ANKLE ARTHROSCOPY WITH OPEN REDUCTION INTERNAL FIXATION (ORIF)     HERNIA REPAIR Left 2001   WOUND EXPLORATION Left 05/01/2021   Procedure: WOUND EXPLORATION-Left small finger wound exploration and tendon repair;  Surgeon: Bradly Bienenstock, MD;  Location: [facility] OR;  Service: Orthopedics;  Laterality: Left;       Home Medications    Prior to Admission medications   Medication Sig Start Date End Date Taking? Authorizing Provider  cefdinir (OMNICEF) 300 MG capsule Take 1 capsule (300 mg total) by mouth 2 (two) times daily. 04/09/22  Yes Aracelie Addis K, PA-C  promethazine-dextromethorphan (PROMETHAZINE-DM) 6.25-15 MG/5ML syrup Take 5 mLs by mouth 3 (three) times daily as needed for cough. 04/09/22  Yes Bracy Pepper K, PA-C  ARIPiprazole (ABILIFY) 10 MG tablet Take by mouth.  11/17/20  [provider]  calcium carbonate (OS-CAL) 1250 (500 Ca) MG chewable tablet Chew by mouth. 05/21/17 01/28/20  [provider]  cloNIDine (CATAPRES) 0.1 MG tablet Take by mouth.  01/28/20  [provider]  FLUoxetine (PROZAC) 20 MG capsule Take by mouth.   01/28/20  [provider]  gabapentin (NEURONTIN) 100 MG capsule Take by mouth. 05/21/17 12/12/19  [provider]  promethazine (PHENERGAN) 12.5 MG tablet Take by mouth. 05/21/17 12/12/19  [provider]    Family History Family History  Problem Relation Age of Onset   Healthy Mother    Healthy Father     Social History Social History   Tobacco Use   Smoking status: Every Day    Packs/day: 0.50    Years: 13.00    Pack years: 6.50    Types: Cigarettes   Smokeless tobacco: Never  Vaping Use   Vaping Use: Never used  Substance Use Topics   Alcohol use: Yes    Comment: occasionally   Drug use: No     Allergies   Hydrocodone-acetaminophen   Review of Systems Review of Systems  Constitutional:  Positive for activity change. Negative for appetite change, fatigue and fever.  HENT:  Positive for congestion, sinus pressure and sore throat. Negative for sneezing.   Respiratory:  Positive for cough. Negative for shortness of breath.   Cardiovascular:  Negative for chest pain.  Gastrointestinal:  Negative for abdominal pain, diarrhea, nausea and vomiting.  Neurological:  Negative for dizziness, light-headedness and headaches.    Physical Exam Triage Vital Signs ED Triage Vitals [04/09/22 1136]  Enc Vitals Group     BP (!) 145/85     Pulse Rate 85     Resp 18     Temp 98.3 F (36.8 C)  Temp Source Oral     SpO2 95 %     Weight      Height      Head Circumference      Peak Flow      Pain Score 0     Pain Loc      Pain Edu?      Excl. in GC?    No data found.  Updated Vital Signs BP (!) 145/85 (BP Location: Left Arm)   Pulse 85   Temp 98.3 F (36.8 C) (Oral)   Resp 18   SpO2 95%   Visual Acuity Right Eye Distance:   Left Eye Distance:   Bilateral Distance:    Right Eye Near:   Left Eye Near:    Bilateral Near:     Physical Exam Vitals reviewed.  Constitutional:      General: He is awake.     Appearance: Normal  appearance. He is well-developed. He is not ill-appearing.     Comments: Very pleasant male appears stated age in no acute distress sitting comfortably in exam room  HENT:     Head: Normocephalic and atraumatic.     Right Ear: Tympanic membrane, ear canal and external ear normal. Tympanic membrane is not erythematous or bulging.     Left Ear: Tympanic membrane, ear canal and external ear normal. Tympanic membrane is not erythematous or bulging.     Nose:     Right Sinus: Maxillary sinus tenderness present. No frontal sinus tenderness.     Left Sinus: Maxillary sinus tenderness present. No frontal sinus tenderness.     Mouth/Throat:     Pharynx: Uvula midline. Posterior oropharyngeal erythema present. No oropharyngeal exudate or uvula swelling.     Comments: Erythema and drainage in posterior oropharynx Cardiovascular:     Rate and Rhythm: Normal rate and regular rhythm.     Heart sounds: Normal heart sounds, S1 normal and S2 normal. No murmur heard. Pulmonary:     Effort: Pulmonary effort is normal. No accessory muscle usage or respiratory distress.     Breath sounds: Normal breath sounds. No stridor. No wheezing, rhonchi or rales.     Comments: Clear to auscultation bilaterally Abdominal:     General: Bowel sounds are normal.     Palpations: Abdomen is soft.     Tenderness: There is no abdominal tenderness.  Neurological:     Mental Status: He is alert.  Psychiatric:        Behavior: Behavior is cooperative.     UC Treatments / Results  Labs (all labs ordered are listed, but only abnormal results are displayed) Labs Reviewed - No data to display  EKG   Radiology No results found.  Procedures Procedures (including critical care time)  Medications Ordered in UC Medications - No data to display  Initial Impression / Assessment and Plan / UC Course  I have reviewed the triage vital signs and the nursing notes.  Pertinent labs & imaging results that were available during  my care of the patient were reviewed by me and considered in my medical decision making (see chart for details).     No indication for viral testing as patient has been symptomatic for 10 days and this would not change management.  Given prolonged and worsening symptoms concern for secondary bacterial infection.  Patient was recently treated with Augmentin so was started on Omnicef twice daily.  He was provided Promethazine DM for cough with instruction not to drive or drink alcohol  with taking this.  He can use over-the-counter medications including Mucinex, Tylenol, Flonase for additional symptom relief.  Recommended rest and drinking plenty of fluid.  X-ray was deferred given clear lung sounds on exam but discussed that if symptoms or not improving quickly he should return for reevaluation at which point imaging may be required.  If symptoms or not improving within a week he is to return for reevaluation.  If any point anything worsens and he develops a fever not respond to medication, chest pain, shortness of breath, nausea/vomiting interfere with oral intake, weakness he needs to go to the emergency room immediately.  Final Clinical Impressions(s) / UC Diagnoses   Final diagnoses:  Sinobronchitis  Acute cough     Discharge Instructions      We are treating you for a sinus infection.  Please start cefdinir twice daily for 10 days.  I have also called in a cough medicine.  This will make you sleepy so do not drive or drink alcohol with taking it.  You can use Mucinex, Flonase, Tylenol for additional symptom relief.  Make sure you rest and drink plenty of fluids.  If symptoms not improving within a week please return.  If anything worsens and you have high fever, worsening cough, shortness of breath, chest pain, nausea/vomiting, weakness you need to be seen immediately.     ED Prescriptions     Medication Sig Dispense Auth. Provider   promethazine-dextromethorphan (PROMETHAZINE-DM) 6.25-15  MG/5ML syrup Take 5 mLs by mouth 3 (three) times daily as needed for cough. 118 mL Bridgitte Felicetti K, PA-C   amoxicillin-clavulanate (AUGMENTIN) 875-125 MG tablet  (Status: Discontinued) Take 1 tablet by mouth every 12 (twelve) hours. 14 tablet Jahkai Yandell K, PA-C   cefdinir (OMNICEF) 300 MG capsule Take 1 capsule (300 mg total) by mouth 2 (two) times daily. 20 capsule Tor Tsuda, Noberto Retort, PA-C      PDMP not reviewed this encounter.   Jeani Hawking, PA-C 04/09/22 1201

## 2022-05-16 ENCOUNTER — Ambulatory Visit (HOSPITAL_COMMUNITY)
Admission: EM | Admit: 2022-05-16 | Discharge: 2022-05-16 | Disposition: A | Discharge status: Home / Self Care | Payer: PRIVATE HEALTH INSURANCE | Attending: Emergency Medicine | Admitting: Emergency Medicine

## 2022-05-16 ENCOUNTER — Ambulatory Visit (INDEPENDENT_AMBULATORY_CARE_PROVIDER_SITE_OTHER): Payer: PRIVATE HEALTH INSURANCE

## 2022-05-16 ENCOUNTER — Encounter (HOSPITAL_COMMUNITY): Payer: Self-pay | Admitting: Emergency Medicine

## 2022-05-16 DIAGNOSIS — L03031 Cellulitis of right toe: Secondary | ICD-10-CM

## 2022-05-16 DIAGNOSIS — R051 Acute cough: Secondary | ICD-10-CM | POA: Diagnosis not present

## 2022-05-16 DIAGNOSIS — L02818 Cutaneous abscess of other sites: Secondary | ICD-10-CM

## 2022-05-16 DIAGNOSIS — R059 Cough, unspecified: Secondary | ICD-10-CM | POA: Diagnosis not present

## 2022-05-16 MED ORDER — DOXYCYCLINE HYCLATE 100 MG PO CAPS: 100.0000 mg | ORAL_CAPSULE | Freq: Two times a day (BID) | ORAL | 0 refills | Status: AC

## 2022-05-16 NOTE — Discharge Instructions (Addendum)
Please take medication as prescribed. I recommend to take with food to prevent upset stomach.  You can apply antibiotic ointment to the toe twice daily.  Please call the lung specialists and make an appointment to follow up regarding your persistent cough.  Please go to the emergency department if symptoms worsen.

## 2022-05-16 NOTE — ED Triage Notes (Signed)
Pt reports still having cough and mucous build up that didn't go away since was seen here in June.  Pt reports pain on right 3rd toe for 3 days. Denies hitting or injury to toe.

## 2022-05-16 NOTE — ED Provider Notes (Signed)
[facility]    CSN: [id number] Arrival date & time: 05/16/22  1507     History   Chief Complaint Chief Complaint  Patient presents with   Cough    HPI Jeffery Santos is a 41 y.o. male.  Presents with cough and mucus production x 1 and 1/2 months. He was seen in June and given [facility] for possible sinus infection.  At that time lungs were clear and x-ray was deferred. Presents today with persistent cough that has not changed since visit.  Denies fevers, night sweats, weight loss, abdominal pain, vomiting/diarrhea.  No chest pain or shortness of breath. No lower extremity swelling.  He quit smoking about 7 months ago.  Was a smoker for 14 years. He is concerned about lung cancer.  Also presents with pain around the third toe on the right foot.  This has been about 4 days.  Has not tried anything for the symptoms.  Denies any injury or trauma to the area.  No bleeding or drainage from the area.  History reviewed. No pertinent past medical history.  There are no problems to display for this patient.   Past Surgical History:  Procedure Laterality Date   ANKLE ARTHROSCOPY WITH OPEN REDUCTION INTERNAL FIXATION (ORIF)     HERNIA REPAIR Left 2001   WOUND EXPLORATION Left 05/01/2021   Procedure: WOUND EXPLORATION-Left small finger wound exploration and tendon repair;  Surgeon: Bradly Bienenstock, MD;  Location: [facility] OR;  Service: Orthopedics;  Laterality: Left;     Home Medications    Prior to Admission medications   Medication Sig Start Date End Date Taking? Authorizing Provider  doxycycline (VIBRAMYCIN) 100 MG capsule Take 1 capsule (100 mg total) by mouth 2 (two) times daily for 5 days. 05/16/22 05/21/22 Yes Jozelynn Danielson, Lurena Joiner, PA-C  ARIPiprazole (ABILIFY) 10 MG tablet Take by mouth.  11/17/20  [provider]  calcium carbonate (OS-CAL) 1250 (500 Ca) MG chewable tablet Chew by mouth. 05/21/17 01/28/20  [provider]  cloNIDine (CATAPRES) 0.1 MG tablet Take by  mouth.  01/28/20  [provider]  FLUoxetine (PROZAC) 20 MG capsule Take by mouth.  01/28/20  [provider]  gabapentin (NEURONTIN) 100 MG capsule Take by mouth. 05/21/17 12/12/19  [provider]  promethazine (PHENERGAN) 12.5 MG tablet Take by mouth. 05/21/17 12/12/19  [provider]    Family History Family History  Problem Relation Age of Onset   Healthy Mother    Healthy Father     Social History Social History   Tobacco Use   Smoking status: Every Day    Packs/day: 0.50    Years: 13.00    Total pack years: 6.50    Types: Cigarettes   Smokeless tobacco: Never  Vaping Use   Vaping Use: Never used  Substance Use Topics   Alcohol use: Yes    Comment: occasionally   Drug use: No     Allergies   Hydrocodone-acetaminophen   Review of Systems Review of Systems  Respiratory:  Positive for cough.    Per HPI  Physical Exam Triage Vital Signs ED Triage Vitals  Enc Vitals Group     BP 05/16/22 1610 113/78     Pulse Rate 05/16/22 1610 100     Resp 05/16/22 1610 18     Temp 05/16/22 1610 98.4 F (36.9 C)     Temp Source 05/16/22 1610 Oral     SpO2 05/16/22 1610 94 %     Weight --  Height --      Head Circumference --      Peak Flow --      Pain Score 05/16/22 1609 10     Pain Loc --      Pain Edu? --      Excl. in [place]? --    No data found.  Updated Vital Signs BP 113/78 (BP Location: Left Arm)   Pulse 100   Temp 98.4 F (36.9 C) (Oral)   Resp 18   SpO2 94%    Physical Exam Vitals and nursing note reviewed.  Constitutional:      General: He is not in acute distress.    Appearance: He is not ill-appearing or diaphoretic.  HENT:     Nose: No congestion.     Mouth/Throat:     Pharynx: Oropharynx is clear.  Eyes:     Conjunctiva/sclera: Conjunctivae normal.  Cardiovascular:     Rate and Rhythm: Normal rate and regular rhythm.     Pulses: Normal pulses.     Heart sounds: Normal heart sounds.  Pulmonary:      Effort: Pulmonary effort is normal. No respiratory distress.     Breath sounds: Normal breath sounds. No wheezing, rhonchi or rales.  Musculoskeletal:        General: Normal range of motion.  Lymphadenopathy:     Cervical: No cervical adenopathy.  Skin:    Comments: Area of pus and erythema to the nail base of the middle toe, right foot.  Neurological:     Mental Status: He is alert and oriented to person, place, and time.     UC Treatments / Results  Labs (all labs ordered are listed, but only abnormal results are displayed) Labs Reviewed - No data to display  EKG  Radiology DG Chest 2 View  Result Date: 05/16/2022 CLINICAL DATA:  Cough EXAM: CHEST - 2 VIEW COMPARISON:  04/21/2021 FINDINGS: The heart size and mediastinal contours are within normal limits. Both lungs are clear. The visualized skeletal structures are unremarkable. IMPRESSION: No active cardiopulmonary disease. Electronically Signed   By: Elmer Picker M.D.   On: 05/16/2022 17:20    Procedures Incision and Drainage  Date/Time: 05/16/2022 5:19 PM  Performed by: Les Pou, PA-C Authorized by: Les Pou, PA-C   Consent:    Consent obtained:  Verbal   Consent given by:  Patient Universal protocol:    Procedure explained and questions answered to patient or proxy's satisfaction: yes     Patient identity confirmed:  Verbally with patient Location:    Type:  Abscess   Location:  Lower extremity   Lower extremity location:  Toe   Toe location:  R third toe Pre-procedure details:    Skin preparation:  Chlorhexidine with alcohol Anesthesia:    Anesthesia method:  Topical application   Topical anesthetic:  LET Procedure type:    Complexity:  Simple Procedure details:    Incision types:  Stab incision   Incision depth:  Dermal   Drainage:  Purulent   Drainage amount:  Scant   Wound treatment:  Wound left open   Packing materials:  None Post-procedure details:    Procedure completion:   Tolerated   Medications Ordered in UC Medications - No data to display  Initial Impression / Assessment and Plan / UC Course  I have reviewed the triage vital signs and the nursing notes.  Pertinent labs & imaging results that were available during my care of the patient were reviewed by me  and considered in my medical decision making (see chart for details).  Lungs are clear on exam however due to the duration of symptoms chest x-ray obtained. X-ray was negative. Given contact information for [facility] pulmonology.  I recommend follow-up with them for further testing and imaging.  Paronychia of the toe drained in clinic with relief of some pain and pressure. Covered with bandage. Going to cover with oral antibiotics for the cough. Doxy twice daily for 5 days. Return precautions discussed. Patient agrees to plan and is discharged in stable condition.  Final Clinical Impressions(s) / UC Diagnoses   Final diagnoses:  Acute cough  Paronychia of third toe of right foot     Discharge Instructions      Please take medication as prescribed. I recommend to take with food to prevent upset stomach.  You can apply antibiotic ointment to the toe twice daily.  Please call the lung specialists and make an appointment to follow up regarding your persistent cough.  Please go to the emergency department if symptoms worsen.     ED Prescriptions     Medication Sig Dispense Auth. Provider   doxycycline (VIBRAMYCIN) 100 MG capsule Take 1 capsule (100 mg total) by mouth 2 (two) times daily for 5 days. 10 capsule Sherice Ijames, Lurena Joiner, PA-C      PDMP not reviewed this encounter.   Maelin Kurkowski, Ray Church 05/16/22 1732

## 2022-05-24 ENCOUNTER — Encounter (HOSPITAL_COMMUNITY): Payer: Self-pay

## 2022-05-24 ENCOUNTER — Ambulatory Visit (HOSPITAL_COMMUNITY)
Admission: EM | Admit: 2022-05-24 | Discharge: 2022-05-24 | Disposition: A | Discharge status: Home / Self Care | Payer: PRIVATE HEALTH INSURANCE | Attending: Emergency Medicine | Admitting: Emergency Medicine

## 2022-05-24 DIAGNOSIS — R6889 Other general symptoms and signs: Secondary | ICD-10-CM

## 2022-05-24 DIAGNOSIS — R0989 Other specified symptoms and signs involving the circulatory and respiratory systems: Secondary | ICD-10-CM | POA: Diagnosis not present

## 2022-05-24 DIAGNOSIS — M542 Cervicalgia: Secondary | ICD-10-CM

## 2022-05-24 DIAGNOSIS — K219 Gastro-esophageal reflux disease without esophagitis: Secondary | ICD-10-CM | POA: Diagnosis not present

## 2022-05-24 MED ORDER — IBUPROFEN 800 MG PO TABS: 800.0000 mg | ORAL_TABLET | Freq: Three times a day (TID) | ORAL | 0 refills | Status: DC

## 2022-05-24 MED ORDER — FAMOTIDINE 20 MG PO TABS: 20.0000 mg | ORAL_TABLET | Freq: Two times a day (BID) | ORAL | 0 refills | Status: DC

## 2022-05-24 MED ORDER — GUAIFENESIN ER 600 MG PO TB12: 600.0000 mg | ORAL_TABLET | Freq: Every day | ORAL | 0 refills | Status: DC

## 2022-05-24 NOTE — Discharge Instructions (Signed)
For your stomach -Your symptoms are consistent with acid reflux -Take famotidine every morning and every evening for the next 14 days -To eat a blander diet with avoidance of spicy or greasy foods or any alcohol use to prevent further irritation -You may attempt use of additional medicine such as Maalox and Pepto-Bismol for additional comfort when symptoms are present -Follow-up with your primary doctor if symptoms persist  For your arm -Likely result of your neck pain causing irritation to the muscle and compressing the nerve -Ibuprofen 800 mg 3 times daily (every 8 hours) for 5 days to help reduce this inflammation -May use ice or heat over the affected areas in 10 to 15-minute intervals -May place pillows underneath the arm and behind her shoulder for additional support -May follow-up with urgent care or your primary doctor if symptoms persist or worsen  For your throat -The phlegm may be related to your sinuses and your history of smoking -Chest x-ray that was completed earlier this month was negative therefore there is no lung involvement -You may take Mucinex daily to help keep secretions thin -Please increase your fluid intake through use of water to help further out your secretions -If symptoms continue to persist please follow-up with your primary doctor for further evaluation
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
## 2022-05-24 NOTE — ED Provider Notes (Signed)
[facility]    CSN: [id number] Arrival date & time: 05/24/22  1646      History   Chief Complaint Chief Complaint  Patient presents with   Abdominal Pain   Numbness    Right arm    HPI Jeffery Santos is a 41 y.o. male.   Right arm numbness of right arm, sleeps in right arm, has occurred before, right sided neck pain as well, neck pain happening in the last two three days, had limited movement,   Right sided abdominal pain, cramping intermittently, worse when eating, feels like he gets a catch, hard to swallow, this has been happen to open, endorses poor diet, with little vegetable and fruit intake, burning sensation the chest, increased gas production, last occurrence 2 days,   History reviewed. No pertinent past medical history.  There are no problems to display for this patient.   Past Surgical History:  Procedure Laterality Date   ANKLE ARTHROSCOPY WITH OPEN REDUCTION INTERNAL FIXATION (ORIF)     HERNIA REPAIR Left 2001   WOUND EXPLORATION Left 05/01/2021   Procedure: WOUND EXPLORATION-Left small finger wound exploration and tendon repair;  Surgeon: Bradly Bienenstock, MD;  Location: [facility];  Service: Orthopedics;  Laterality: Left;       Home Medications    Prior to Admission medications   Medication Sig Start Date End Date Taking? Authorizing Provider  ARIPiprazole (ABILIFY) 10 MG tablet Take by mouth.  11/17/20  [provider]  calcium carbonate (OS-CAL) 1250 (500 Ca) MG chewable tablet Chew by mouth. 05/21/17 01/28/20  [provider]  cloNIDine (CATAPRES) 0.1 MG tablet Take by mouth.  01/28/20  [provider]  FLUoxetine (PROZAC) 20 MG capsule Take by mouth.  01/28/20  [provider]  gabapentin (NEURONTIN) 100 MG capsule Take by mouth. 05/21/17 12/12/19  [provider]  promethazine (PHENERGAN) 12.5 MG tablet Take by mouth. 05/21/17 12/12/19  [provider]    Family History Family History  Problem  Relation Age of Onset   Healthy Mother    Healthy Father     Social History Social History   Tobacco Use   Smoking status: Every Day    Packs/day: 0.50    Years: 13.00    Total pack years: 6.50    Types: Cigarettes   Smokeless tobacco: Never  Vaping Use   Vaping Use: Never used  Substance Use Topics   Alcohol use: Yes    Comment: occasionally   Drug use: No     Allergies   Hydrocodone-acetaminophen   Review of Systems Review of Systems  Constitutional: Negative.   HENT: Negative.    Respiratory: Negative.    Cardiovascular: Negative.   Gastrointestinal:  Positive for abdominal pain. Negative for abdominal distention, anal bleeding, blood in stool, constipation, diarrhea, nausea, rectal pain and vomiting.  Skin: Negative.      Physical Exam Triage Vital Signs ED Triage Vitals  Enc Vitals Group     BP 05/24/22 1717 (!) 146/88     Pulse Rate 05/24/22 1717 85     Resp 05/24/22 1717 16     Temp 05/24/22 1717 98.7 F (37.1 C)     Temp Source 05/24/22 1717 Oral     SpO2 05/24/22 1717 94 %     Weight 05/24/22 1722 264 lb 15.9 oz (120.2 kg)     Height 05/24/22 1722 5\' 11"  (1.803 m)     Head Circumference --      Peak Flow --  Pain Score 05/24/22 1721 3     Pain Loc --      Pain Edu? --      Excl. in [place]? --    No data found.  Updated Vital Signs BP (!) 146/88 (BP Location: Left Arm)   Pulse 85   Temp 98.7 F (37.1 C) (Oral)   Resp 16   Ht 5\' 11"  (1.803 m)   Wt 264 lb 15.9 oz (120.2 kg)   SpO2 94%   BMI 36.96 kg/m   Visual Acuity Right Eye Distance:   Left Eye Distance:   Bilateral Distance:    Right Eye Near:   Left Eye Near:    Bilateral Near:     Physical Exam Constitutional:      Appearance: Normal appearance.  HENT:     Head: Normocephalic.  Eyes:     Extraocular Movements: Extraocular movements intact.  Pulmonary:     Effort: Pulmonary effort is normal.  Skin:    General: Skin is warm and dry.  Neurological:     Mental  Status: He is alert and oriented to person, place, and time. Mental status is at baseline.  Psychiatric:        Mood and Affect: Mood normal.        Behavior: Behavior normal.      UC Treatments / Results  Labs (all labs ordered are listed, but only abnormal results are displayed) Labs Reviewed - No data to display  EKG   Radiology No results found.  Procedures Procedures (including critical care time)  Medications Ordered in UC Medications - No data to display  Initial Impression / Assessment and Plan / UC Course  I have reviewed the triage vital signs and the nursing notes.  Pertinent labs & imaging results that were available during my care of the patient were reviewed by me and considered in my medical decision making (see chart for details).   Final Clinical Impressions(s) / UC Diagnoses   Final diagnoses:  None   Discharge Instructions   None    ED Prescriptions   None    PDMP not reviewed this encounter.

## 2022-06-10 ENCOUNTER — Ambulatory Visit (HOSPITAL_COMMUNITY)
Admission: EM | Admit: 2022-06-10 | Discharge: 2022-06-10 | Disposition: A | Discharge status: Home / Self Care | Payer: PRIVATE HEALTH INSURANCE | Attending: Family Medicine | Admitting: Family Medicine

## 2022-06-10 ENCOUNTER — Encounter (HOSPITAL_COMMUNITY): Payer: Self-pay

## 2022-06-10 DIAGNOSIS — N481 Balanitis: Secondary | ICD-10-CM

## 2022-06-10 DIAGNOSIS — A6001 Herpesviral infection of penis: Secondary | ICD-10-CM | POA: Diagnosis not present

## 2022-06-10 HISTORY — DX: Gastro-esophageal reflux disease without esophagitis: K21.9

## 2022-06-10 MED ORDER — VALACYCLOVIR HCL 1 G PO TABS: 1000.0000 mg | ORAL_TABLET | Freq: Three times a day (TID) | ORAL | 0 refills | Status: DC

## 2022-06-10 MED ORDER — KETOCONAZOLE 2 % EX CREA: 1.0000 | TOPICAL_CREAM | Freq: Every day | CUTANEOUS | 0 refills | Status: DC

## 2022-06-10 NOTE — ED Triage Notes (Signed)
Onset 3 -4 days ago Patient having penile bumps.  Patient states he is having swelling, white discoloration, and pain. Patient states looks like small lesions.   Patient last had intercourse a few weeks ago(4) with a condom.

## 2022-06-11 NOTE — ED Provider Notes (Signed)
[facility]   [id number] 06/10/22 Arrival Time: 1912  ASSESSMENT & PLAN:  1. Herpes simplex infection of penis   2. Balanitis    Begin: Meds ordered this encounter  Medications   ketoconazole (NIZORAL) 2 % cream    Sig: Apply 1 Application topically daily.    Dispense:  30 g    Refill:  0   valACYclovir (VALTREX) 1000 MG tablet    Sig: Take 1 tablet (1,000 mg total) by mouth 3 (three) times daily.    Dispense:  21 tablet    Refill:  0   Desires urology information to discuss adult circumcision. He is so worked up over suspected genital herpes he doesn't want to discuss other STI testing. Will notify of any positive results. Instructed to refrain from sexual activity for at least seven days.   Follow-up Information     Schedule an appointment as soon as possible for a visit  with [facility].   Why: To discuss circumcision. Contact information: [address] [place] [address] [place] [address] [phone]                Reviewed expectations re: course of current medical issues. Questions answered. Outlined signs and symptoms indicating need for more acute intervention. Patient verbalized understanding. After Visit Summary given.   SUBJECTIVE:  Jeffery KOLODZIEJSKI is a 41 y.o. male who presents with complaint of penile irritation/painful lesions. Noted 3-4 d ago. New male partner approx 1 mo ago. Also white discoloration of head of penis; weepy at times. No specific penile discharge. Normal urination without dysuria.  OBJECTIVE:  Vitals:   06/10/22 1919 06/10/22 1923  BP: 128/73   Pulse: 97   Resp: 16   Temp: 98.3 F (36.8 C)   TempSrc: Oral   SpO2: 97%   Weight:  120.2 kg  Height:  5\' 11"  (1.803 m)    General appearance: alert, cooperative, appears stated age and no distress Throat: lips, mucosa, and tongue normal; teeth and gums normal Lungs: unlabored respirations; speaks full sentences without difficulty Back: no CVA  tenderness; FROM at waist Abdomen: soft, non-tender GU: cluster of 2-73mm ulcerative lesions on shaft of penis; glans of penis weepy with whitish debris under foreskin; uncircumcised; no penile discharge Skin: warm and dry Psychological: alert and cooperative; normal mood and affect.   Labs Reviewed - No data to display  Allergies  Allergen Reactions   Hydrocodone-Acetaminophen Nausea And Vomiting    Past Medical History:  Diagnosis Date   Acid reflux    Family History  Problem Relation Age of Onset   Healthy Mother    Healthy Father    Social History   Socioeconomic History   Marital status: Single    Spouse name: Not on file   Number of children: Not on file   Years of education: Not on file   Highest education level: Not on file  Occupational History   Not on file  Tobacco Use   Smoking status: Every Day    Packs/day: 0.50    Years: 13.00    Total pack years: 6.50    Types: Cigarettes   Smokeless tobacco: Never  Vaping Use   Vaping Use: Never used  Substance and Sexual Activity   Alcohol use: Yes    Comment: occasionally   Drug use: No   Sexual activity: Yes  Other Topics Concern   Not on file  Social History Narrative   Not on file   Social  Determinants of Health   Financial Resource Strain: Not on file  Food Insecurity: Not on file  Transportation Needs: Not on file  Physical Activity: Not on file  Stress: Not on file  Social Connections: Not on file  Intimate Partner Violence: Not on file           Mardella Layman, MD 06/11/22 [phone]

## 2022-07-05 ENCOUNTER — Encounter (HOSPITAL_COMMUNITY): Payer: Self-pay | Admitting: *Deleted

## 2022-07-05 ENCOUNTER — Other Ambulatory Visit: Payer: Self-pay

## 2022-07-05 ENCOUNTER — Inpatient Hospital Stay (HOSPITAL_COMMUNITY)
Admission: EM | Admit: 2022-07-05 | Discharge: 2022-07-08 | DRG: 639 | Disposition: A | Discharge status: Home / Self Care | Payer: PRIVATE HEALTH INSURANCE | Attending: Internal Medicine | Admitting: Internal Medicine

## 2022-07-05 DIAGNOSIS — K219 Gastro-esophageal reflux disease without esophagitis: Secondary | ICD-10-CM | POA: Diagnosis present

## 2022-07-05 DIAGNOSIS — E1165 Type 2 diabetes mellitus with hyperglycemia: Secondary | ICD-10-CM

## 2022-07-05 DIAGNOSIS — Z6834 Body mass index (BMI) 34.0-34.9, adult: Secondary | ICD-10-CM

## 2022-07-05 DIAGNOSIS — E119 Type 2 diabetes mellitus without complications: Secondary | ICD-10-CM | POA: Diagnosis not present

## 2022-07-05 DIAGNOSIS — E860 Dehydration: Secondary | ICD-10-CM | POA: Diagnosis present

## 2022-07-05 DIAGNOSIS — E781 Pure hyperglyceridemia: Secondary | ICD-10-CM | POA: Diagnosis present

## 2022-07-05 DIAGNOSIS — F4310 Post-traumatic stress disorder, unspecified: Secondary | ICD-10-CM | POA: Diagnosis not present

## 2022-07-05 DIAGNOSIS — E1100 Type 2 diabetes mellitus with hyperosmolarity without nonketotic hyperglycemic-hyperosmolar coma (NKHHC): Secondary | ICD-10-CM | POA: Diagnosis not present

## 2022-07-05 DIAGNOSIS — R739 Hyperglycemia, unspecified: Secondary | ICD-10-CM | POA: Diagnosis not present

## 2022-07-05 DIAGNOSIS — K6289 Other specified diseases of anus and rectum: Secondary | ICD-10-CM | POA: Diagnosis present

## 2022-07-05 DIAGNOSIS — E785 Hyperlipidemia, unspecified: Secondary | ICD-10-CM | POA: Diagnosis present

## 2022-07-05 DIAGNOSIS — E669 Obesity, unspecified: Secondary | ICD-10-CM | POA: Diagnosis present

## 2022-07-05 DIAGNOSIS — F17210 Nicotine dependence, cigarettes, uncomplicated: Secondary | ICD-10-CM | POA: Diagnosis present

## 2022-07-05 LAB — I-STAT VENOUS BLOOD GAS, ED
Acid-Base Excess: 0 mmol/L (ref 0.0–2.0)
Bicarbonate: 24.3 mmol/L (ref 20.0–28.0)
Calcium, Ion: 1.04 mmol/L — ABNORMAL LOW (ref 1.15–1.40)
HCT: 51 % (ref 39.0–52.0)
Hemoglobin: 17.3 g/dL — ABNORMAL HIGH (ref 13.0–17.0)
O2 Saturation: 78 %
Potassium: 4.3 mmol/L (ref 3.5–5.1)
Sodium: 130 mmol/L — ABNORMAL LOW (ref 135–145)
TCO2: 25 mmol/L (ref 22–32)
pCO2, Ven: 36.6 mmHg — ABNORMAL LOW (ref 44–60)
pH, Ven: 7.429 (ref 7.25–7.43)
pO2, Ven: 41 mmHg (ref 32–45)

## 2022-07-05 LAB — CBG MONITORING, ED: Glucose-Capillary: 558 mg/dL (ref 70–99)

## 2022-07-05 LAB — COMPREHENSIVE METABOLIC PANEL
ALT: 28 U/L (ref 0–44)
AST: 24 U/L (ref 15–41)
Albumin: 3.1 g/dL — ABNORMAL LOW (ref 3.5–5.0)
Alkaline Phosphatase: 86 U/L (ref 38–126)
Anion gap: 13 (ref 5–15)
BUN: 14 mg/dL (ref 6–20)
CO2: 21 mmol/L — ABNORMAL LOW (ref 22–32)
Calcium: 8.2 mg/dL — ABNORMAL LOW (ref 8.9–10.3)
Chloride: 92 mmol/L — ABNORMAL LOW (ref 98–111)
Creatinine, Ser: 1.36 mg/dL — ABNORMAL HIGH (ref 0.61–1.24)
GFR, Estimated: >60 mL/min (ref >60)
Glucose, Bld: 684 mg/dL (ref 70–99)
Potassium: 4.8 mmol/L (ref 3.5–5.1)
Sodium: 126 mmol/L — ABNORMAL LOW (ref 135–145)
Total Bilirubin: 0.8 mg/dL (ref 0.3–1.2)
Total Protein: 5.8 g/dL — ABNORMAL LOW (ref 6.5–8.1)

## 2022-07-05 LAB — URINALYSIS, ROUTINE W REFLEX MICROSCOPIC
Bacteria, UA: NONE SEEN
Bilirubin Urine: NEGATIVE
Glucose, UA: >=500 mg/dL — AB
Hgb urine dipstick: NEGATIVE
Ketones, ur: 5 mg/dL — AB
Leukocytes,Ua: NEGATIVE
Nitrite: NEGATIVE
Protein, ur: NEGATIVE mg/dL
Specific Gravity, Urine: 1.026 (ref 1.005–1.030)
pH: 6 (ref 5.0–8.0)

## 2022-07-05 LAB — CBC
HCT: 48 % (ref 39.0–52.0)
Hemoglobin: 17.1 g/dL — ABNORMAL HIGH (ref 13.0–17.0)
MCH: 28.9 pg (ref 26.0–34.0)
MCHC: 35.6 g/dL (ref 30.0–36.0)
MCV: 81.1 fL (ref 80.0–100.0)
Platelets: 261 10*3/uL (ref 150–400)
RBC: 5.92 MIL/uL — ABNORMAL HIGH (ref 4.22–5.81)
RDW: 12.4 % (ref 11.5–15.5)
WBC: 6.1 10*3/uL (ref 4.0–10.5)
nRBC: 0 % (ref 0.0–0.2)

## 2022-07-05 LAB — BETA-HYDROXYBUTYRIC ACID: Beta-Hydroxybutyric Acid: 1.15 mmol/L — ABNORMAL HIGH (ref 0.05–0.27)

## 2022-07-05 LAB — HEMOGLOBIN A1C
Hgb A1c MFr Bld: 14.9 % — ABNORMAL HIGH (ref 4.8–5.6)
Mean Plasma Glucose: 380.93 mg/dL

## 2022-07-05 LAB — LIPASE, BLOOD: Lipase: 56 U/L — ABNORMAL HIGH (ref 11–51)

## 2022-07-05 MED ORDER — INSULIN REGULAR(HUMAN) IN NACL 100-0.9 UT/100ML-% IV SOLN
INTRAVENOUS | Status: DC
  Administered 2022-07-06: 15 [IU]/h via INTRAVENOUS
  Filled 2022-07-05: qty 100

## 2022-07-05 MED ORDER — LACTATED RINGERS IV SOLN: INTRAVENOUS | Status: DC

## 2022-07-05 MED ORDER — INSULIN REGULAR(HUMAN) IN NACL 100-0.9 UT/100ML-% IV SOLN: INTRAVENOUS | Status: DC

## 2022-07-05 MED ORDER — DEXTROSE 50 % IV SOLN: 0.0000 mL | INTRAVENOUS | Status: DC | PRN

## 2022-07-05 MED ORDER — ENOXAPARIN SODIUM 40 MG/0.4ML IJ SOSY
40.0000 mg | PREFILLED_SYRINGE | INTRAMUSCULAR | Status: DC
  Administered 2022-07-06 – 2022-07-08 (×3): 40 mg via SUBCUTANEOUS
  Filled 2022-07-05 (×3): qty 0.4

## 2022-07-05 MED ORDER — SODIUM CHLORIDE 0.9 % IV BOLUS
1000.0000 mL | Freq: Once | INTRAVENOUS | Status: AC
  Administered 2022-07-06: 1000 mL via INTRAVENOUS

## 2022-07-05 MED ORDER — POTASSIUM CHLORIDE 10 MEQ/100ML IV SOLN
10.0000 meq | INTRAVENOUS | Status: AC
  Administered 2022-07-06 (×2): 10 meq via INTRAVENOUS
  Filled 2022-07-05 (×2): qty 100

## 2022-07-05 MED ORDER — ONDANSETRON HCL 4 MG/2ML IJ SOLN
4.0000 mg | Freq: Once | INTRAMUSCULAR | Status: AC
  Administered 2022-07-05: 4 mg via INTRAVENOUS
  Filled 2022-07-05: qty 2

## 2022-07-05 MED ORDER — SODIUM CHLORIDE 0.9 % IV BOLUS
1000.0000 mL | Freq: Once | INTRAVENOUS | Status: AC
  Administered 2022-07-05: 1000 mL via INTRAVENOUS

## 2022-07-05 MED ORDER — DEXTROSE IN LACTATED RINGERS 5 % IV SOLN: INTRAVENOUS | Status: DC

## 2022-07-05 NOTE — Assessment & Plan Note (Signed)
Continue home psych meds when med rec is completed.

## 2022-07-05 NOTE — ED Provider Notes (Signed)
[facility] EMERGENCY DEPARTMENT Provider Note   CSN: [id number] Arrival date & time: 07/05/22  1829     History {Add pertinent medical, surgical, social history, OB history to HPI:1} Chief Complaint  Patient presents with   Fatigue    Jeffery Santos is a 41 y.o. male.    The history is provided by the patient and medical records.   41 year old male with history of GERD, presenting to the ED with generalized fatigue.  He reports for several months now he has been feeling generally unwell with low energy, intermittent lightheadedness, and upset stomach.  He states he has lost quite a bit of weight and his eating patterns have changed dramatically.  He reports frequent thirst and urination, often getting up 10+ times at night to urinate.  He denies any dysuria or flank pain.  He denies any known history of diabetes.   Home Medications Prior to Admission medications   Medication Sig Start Date End Date Taking? Authorizing Provider  famotidine (PEPCID) 20 MG tablet Take 1 tablet (20 mg total) by mouth 2 (two) times daily. 05/24/22   Valinda Hoar, NP  guaiFENesin (MUCINEX) 600 MG 12 hr tablet Take 1 tablet (600 mg total) by mouth daily. 05/24/22   White, Elita Boone, NP  ibuprofen (ADVIL) 800 MG tablet Take 1 tablet (800 mg total) by mouth 3 (three) times daily. 05/24/22   White, Elita Boone, NP  ketoconazole (NIZORAL) 2 % cream Apply 1 Application topically daily. 06/10/22   Mardella Layman, MD  valACYclovir (VALTREX) 1000 MG tablet Take 1 tablet (1,000 mg total) by mouth 3 (three) times daily. 06/10/22   Mardella Layman, MD  ARIPiprazole (ABILIFY) 10 MG tablet Take by mouth.  11/17/20  [provider]  calcium carbonate (OS-CAL) 1250 (500 Ca) MG chewable tablet Chew by mouth. 05/21/17 01/28/20  [provider]  cloNIDine (CATAPRES) 0.1 MG tablet Take by mouth.  01/28/20  [provider]  FLUoxetine (PROZAC) 20 MG capsule Take by mouth.  01/28/20  [provider]  gabapentin (NEURONTIN) 100 MG capsule Take by mouth. 05/21/17 12/12/19  [provider]  promethazine (PHENERGAN) 12.5 MG tablet Take by mouth. 05/21/17 12/12/19  [provider]      Allergies    Hydrocodone-acetaminophen    Review of Systems   Review of Systems  Gastrointestinal:  Positive for nausea.  Endocrine: Positive for polydipsia and polyuria.  All other systems reviewed and are negative.   Physical Exam Updated Vital Signs BP 122/83   Pulse 89   Temp 99.1 F (37.3 C) (Oral)   Resp (!) 21   Ht 5\' 11"  (1.803 m)   Wt 120.2 kg   SpO2 97%   BMI 36.96 kg/m  Physical Exam Vitals and nursing note reviewed.  Constitutional:      Appearance: He is well-developed.  HENT:     Head: Normocephalic and atraumatic.     Mouth/Throat:     Comments: Mucous membranes dry Eyes:     Conjunctiva/sclera: Conjunctivae normal.     Pupils: Pupils are equal, round, and reactive to light.  Cardiovascular:     Rate and Rhythm: Normal rate and regular rhythm.     Heart sounds: Normal heart sounds.  Pulmonary:     Effort: Pulmonary effort is normal. No respiratory distress.     Breath sounds: Normal breath sounds. No rhonchi.  Abdominal:     General: Bowel sounds are normal.     Palpations: Abdomen is  soft.     Tenderness: There is no abdominal tenderness. There is no rebound.     Comments: Soft, non-tender  Musculoskeletal:        General: Normal range of motion.     Cervical back: Normal range of motion.  Skin:    General: Skin is warm and dry.  Neurological:     Mental Status: He is alert and oriented to person, place, and time.     ED Results / Procedures / Treatments   Labs (all labs ordered are listed, but only abnormal results are displayed) Labs Reviewed  LIPASE, BLOOD - Abnormal; Notable for the following components:      Result Value   Lipase 56 (*)    All other components within normal limits  COMPREHENSIVE METABOLIC PANEL -  Abnormal; Notable for the following components:   Sodium 126 (*)    Chloride 92 (*)    CO2 21 (*)    Glucose, Bld 684 (*)    Creatinine, Ser 1.36 (*)    Calcium 8.2 (*)    Total Protein 5.8 (*)    Albumin 3.1 (*)    All other components within normal limits  CBC - Abnormal; Notable for the following components:   RBC 5.92 (*)    Hemoglobin 17.1 (*)    All other components within normal limits  URINALYSIS, ROUTINE W REFLEX MICROSCOPIC - Abnormal; Notable for the following components:   Color, Urine STRAW (*)    Glucose, UA >=500 (*)    Ketones, ur 5 (*)    All other components within normal limits  HEMOGLOBIN A1C - Abnormal; Notable for the following components:   Hgb A1c MFr Bld 14.9 (*)    All other components within normal limits  CBG MONITORING, ED - Abnormal; Notable for the following components:   Glucose-Capillary 558 (*)    All other components within normal limits  I-STAT VENOUS BLOOD GAS, ED - Abnormal; Notable for the following components:   pCO2, Ven 36.6 (*)    Sodium 130 (*)    Calcium, Ion 1.04 (*)    Hemoglobin 17.3 (*)    All other components within normal limits  BETA-HYDROXYBUTYRIC ACID    EKG None  Radiology No results found.  Procedures Procedures  {Document cardiac monitor, telemetry assessment procedure when appropriate:1}  CRITICAL CARE Performed by: Larene Pickett   Total critical care time: 45 minutes  Critical care time was exclusive of separately billable procedures and treating other patients.  Critical care was necessary to treat or prevent imminent or life-threatening deterioration.  Critical care was time spent personally by me on the following activities: development of treatment plan with patient and/or surrogate as well as nursing, discussions with consultants, evaluation of patient's response to treatment, examination of patient, obtaining history from patient or surrogate, ordering and performing treatments and interventions,  ordering and review of laboratory studies, ordering and review of radiographic studies, pulse oximetry and re-evaluation of patient's condition.   Medications Ordered in ED Medications  sodium chloride 0.9 % bolus 1,000 mL (has no administration in time range)  insulin regular, human (MYXREDLIN) 100 units/ 100 mL infusion (has no administration in time range)  lactated ringers infusion (has no administration in time range)  dextrose 5 % in lactated ringers infusion (has no administration in time range)  dextrose 50 % solution 0-50 mL (has no administration in time range)  potassium chloride 10 mEq in 100 mL IVPB (has no administration in time range)  sodium chloride 0.9 % bolus 1,000 mL (1,000 mLs Intravenous New Bag/Given 07/05/22 2252)  ondansetron (ZOFRAN) injection 4 mg (4 mg Intravenous Given 07/05/22 2251)    ED Course/ Medical Decision Making/ A&P                           Medical Decision Making Amount and/or Complexity of Data Reviewed Labs: ordered. ECG/medicine tests: ordered and independent interpretation performed.  Risk Prescription drug management. Decision regarding hospitalization.   41 year old male presenting to the ED with several months of fatigue and intermittent lightheadedness.  He reports weight loss, excessive thirst and urination.  No known history of diabetes.  He is afebrile and nontoxic in appearance.  His mucous membranes are dry.  Labs were obtained from triage--appears to have new onset diabetes.  Glucose is 684.  Bicarb is mildly low at 21, anion gap is 13.  Minimal ketones in the urine.  pH is normal on VBG.  Hemoglobin A1c 14.9.  Patient given IV fluids, started on glucose stabilizer.  He will require admission for ongoing management.  Final Clinical Impression(s) / ED Diagnoses Final diagnoses:  Hyperglycemia  New onset type 2 diabetes mellitus (HCC)    Rx / DC Orders ED Discharge Orders     None

## 2022-07-05 NOTE — H&P (Signed)
History and Physical    Patient: Jeffery Santos NOI:[id number] DOB: Mar 27, 1981 DOA: 07/05/2022 DOS: the patient was seen and examined on 07/05/2022 PCP: Pcp, No  Patient coming from: Home  Chief Complaint:  Chief Complaint  Patient presents with   Fatigue   HPI: Jeffery Santos is a 40 y.o. male with medical history significant of PTSD.  Pt presents to ED with c/o polyuria, polydipsia, wt loss.  Symptoms onset 2 weeks ago, constant, nothing makes better or worse.  Pt does not have a prior diagnosis of DM.  Hasnt seen PCP for this yet.  Came in to ED for symptoms.   Review of Systems: As mentioned in the history of present illness. All other systems reviewed and are negative. Past Medical History:  Diagnosis Date   Acid reflux    Past Surgical History:  Procedure Laterality Date   ANKLE ARTHROSCOPY WITH OPEN REDUCTION INTERNAL FIXATION (ORIF)     HERNIA REPAIR Left 2001   WOUND EXPLORATION Left 05/01/2021   Procedure: WOUND EXPLORATION-Left small finger wound exploration and tendon repair;  Surgeon: Bradly Bienenstock, MD;  Location: [facility] OR;  Service: Orthopedics;  Laterality: Left;   Social History:  reports that he has been smoking cigarettes. He has a 6.50 pack-year smoking history. He has never used smokeless tobacco. He reports current alcohol use. He reports that he does not use drugs.  Allergies  Allergen Reactions   Hydrocodone-Acetaminophen Nausea And Vomiting    Family History  Problem Relation Age of Onset   Healthy Mother    Healthy Father     Prior to Admission medications   Medication Sig Start Date End Date Taking? Authorizing Provider  famotidine (PEPCID) 20 MG tablet Take 1 tablet (20 mg total) by mouth 2 (two) times daily. 05/24/22   Valinda Hoar, NP  guaiFENesin (MUCINEX) 600 MG 12 hr tablet Take 1 tablet (600 mg total) by mouth daily. 05/24/22   White, Elita Boone, NP  ibuprofen (ADVIL) 800 MG tablet Take 1 tablet (800 mg total) by mouth 3 (three) times  daily. 05/24/22   White, Elita Boone, NP  ketoconazole (NIZORAL) 2 % cream Apply 1 Application topically daily. 06/10/22   Mardella Layman, MD  valACYclovir (VALTREX) 1000 MG tablet Take 1 tablet (1,000 mg total) by mouth 3 (three) times daily. 06/10/22   Mardella Layman, MD  ARIPiprazole (ABILIFY) 10 MG tablet Take by mouth.  11/17/20  [provider]  calcium carbonate (OS-CAL) 1250 (500 Ca) MG chewable tablet Chew by mouth. 05/21/17 01/28/20  [provider]  cloNIDine (CATAPRES) 0.1 MG tablet Take by mouth.  01/28/20  [provider]  FLUoxetine (PROZAC) 20 MG capsule Take by mouth.  01/28/20  [provider]  gabapentin (NEURONTIN) 100 MG capsule Take by mouth. 05/21/17 12/12/19  [provider]  promethazine (PHENERGAN) 12.5 MG tablet Take by mouth. 05/21/17 12/12/19  [provider]    Physical Exam: Vitals:   07/05/22 1842 07/05/22 1859 07/05/22 2211 07/05/22 2215  BP: 124/87   122/83  Pulse: (!) 104   89  Resp: 18   (!) 21  Temp: 98.8 F (37.1 C)  99.1 F (37.3 C)   TempSrc: Oral  Oral   SpO2: 98%   97%  Weight:  120.2 kg    Height:  5\' 11"  (1.803 m)     Constitutional: NAD, calm, comfortable Eyes: PERRL, lids and conjunctivae normal ENMT: Mucous membranes are moist. Posterior pharynx clear of any exudate or lesions.Normal dentition.  Neck: normal, supple, no masses, no thyromegaly Respiratory: clear to auscultation bilaterally, no wheezing, no crackles. Normal respiratory effort. No accessory muscle use.  Cardiovascular: Regular rate and rhythm, no murmurs / rubs / gallops. No extremity edema. 2+ pedal pulses. No carotid bruits.  Abdomen: no tenderness, no masses palpated. No hepatosplenomegaly. Bowel sounds positive.  Musculoskeletal: no clubbing / cyanosis. No joint deformity upper and lower extremities. Good ROM, no contractures. Normal muscle tone.  Skin: no rashes, lesions, ulcers. No induration Neurologic: CN 2-12 grossly intact.  Sensation intact, DTR normal. Strength 5/5 in all 4.  Psychiatric: Normal judgment and insight. Alert and oriented x 3. Normal mood.   Data Reviewed: {Tip this will not be part of the note when signed- Document your independent interpretation of telemetry tracing, EKG, lab, Radiology test or any other diagnostic tests. Add any new diagnostic test ordered today. (Optional):26781}   BGL 684 Normal anion gap 5 keytones in urine A1C is 14.9  Assessment and Plan: * Hyperglycemia due to diabetes mellitus (HCC) New diagnosis of DM2 with A1C 14.9. Hyperglycemic crisis pathway Insulin gtt and IVF per pathway Tele monitor Daily BMP Diabetes coordinator consult No anion gap to suggest DKA (and only 5 keytones in urine).  PTSD (post-traumatic stress disorder) Continue home psych meds when med rec is completed.      Advance Care Planning:   Code Status: Full Code  Consults: none  Family Communication: ***  Severity of Illness: The appropriate patient status for this patient is OBSERVATION. Observation status is judged to be reasonable and necessary in order to provide the required intensity of service to ensure the patient's safety. The patient's presenting symptoms, physical exam findings, and initial radiographic and laboratory data in the context of their medical condition is felt to place them at decreased risk for further clinical deterioration. Furthermore, it is anticipated that the patient will be medically stable for discharge from the hospital within 2 midnights of admission.   Author: Hillary Bow., DO 07/05/2022 11:43 PM  For on call review [email or web address].

## 2022-07-05 NOTE — ED Triage Notes (Signed)
The pt is c/o fatigue with excessive thirst  excessive urinating and a weight loss for 2 weeks  he has a regular doctor but did not see him  no diagnosis of diabetes

## 2022-07-05 NOTE — Assessment & Plan Note (Signed)
New diagnosis of DM2 with A1C 14.9. 1. Hyperglycemic crisis pathway 2. Insulin gtt and IVF per pathway 3. Tele monitor 4. Daily BMP 5. Diabetes coordinator consult 6. No anion gap to suggest DKA (and only 5 keytones in urine).

## 2022-07-06 DIAGNOSIS — K219 Gastro-esophageal reflux disease without esophagitis: Secondary | ICD-10-CM | POA: Diagnosis present

## 2022-07-06 DIAGNOSIS — F17210 Nicotine dependence, cigarettes, uncomplicated: Secondary | ICD-10-CM | POA: Diagnosis present

## 2022-07-06 DIAGNOSIS — E669 Obesity, unspecified: Secondary | ICD-10-CM | POA: Diagnosis present

## 2022-07-06 DIAGNOSIS — Z6834 Body mass index (BMI) 34.0-34.9, adult: Secondary | ICD-10-CM | POA: Diagnosis not present

## 2022-07-06 DIAGNOSIS — F4310 Post-traumatic stress disorder, unspecified: Secondary | ICD-10-CM | POA: Diagnosis present

## 2022-07-06 DIAGNOSIS — E860 Dehydration: Secondary | ICD-10-CM | POA: Diagnosis present

## 2022-07-06 DIAGNOSIS — R739 Hyperglycemia, unspecified: Secondary | ICD-10-CM | POA: Diagnosis present

## 2022-07-06 DIAGNOSIS — K6289 Other specified diseases of anus and rectum: Secondary | ICD-10-CM | POA: Diagnosis present

## 2022-07-06 DIAGNOSIS — E1165 Type 2 diabetes mellitus with hyperglycemia: Secondary | ICD-10-CM | POA: Diagnosis not present

## 2022-07-06 DIAGNOSIS — E1100 Type 2 diabetes mellitus with hyperosmolarity without nonketotic hyperglycemic-hyperosmolar coma (NKHHC): Secondary | ICD-10-CM | POA: Diagnosis present

## 2022-07-06 DIAGNOSIS — E781 Pure hyperglyceridemia: Secondary | ICD-10-CM | POA: Diagnosis present

## 2022-07-06 DIAGNOSIS — E785 Hyperlipidemia, unspecified: Secondary | ICD-10-CM | POA: Diagnosis present

## 2022-07-06 LAB — CBG MONITORING, ED
Glucose-Capillary: 186 mg/dL — ABNORMAL HIGH (ref 70–99)
Glucose-Capillary: 195 mg/dL — ABNORMAL HIGH (ref 70–99)
Glucose-Capillary: 225 mg/dL — ABNORMAL HIGH (ref 70–99)
Glucose-Capillary: 266 mg/dL — ABNORMAL HIGH (ref 70–99)
Glucose-Capillary: 340 mg/dL — ABNORMAL HIGH (ref 70–99)
Glucose-Capillary: 398 mg/dL — ABNORMAL HIGH (ref 70–99)

## 2022-07-06 LAB — COMPREHENSIVE METABOLIC PANEL
ALT: 29 U/L (ref 0–44)
AST: 21 U/L (ref 15–41)
Albumin: 2.7 g/dL — ABNORMAL LOW (ref 3.5–5.0)
Alkaline Phosphatase: 52 U/L (ref 38–126)
Anion gap: 5 (ref 5–15)
BUN: 8 mg/dL (ref 6–20)
CO2: 27 mmol/L (ref 22–32)
Calcium: 8.1 mg/dL — ABNORMAL LOW (ref 8.9–10.3)
Chloride: 104 mmol/L (ref 98–111)
Creatinine, Ser: 0.99 mg/dL (ref 0.61–1.24)
GFR, Estimated: >60 mL/min (ref >60)
Glucose, Bld: 137 mg/dL — ABNORMAL HIGH (ref 70–99)
Potassium: 3.6 mmol/L (ref 3.5–5.1)
Sodium: 136 mmol/L (ref 135–145)
Total Bilirubin: 0.8 mg/dL (ref 0.3–1.2)
Total Protein: 5.2 g/dL — ABNORMAL LOW (ref 6.5–8.1)

## 2022-07-06 LAB — CBC
HCT: 43.3 % (ref 39.0–52.0)
Hemoglobin: 15 g/dL (ref 13.0–17.0)
MCH: 28.5 pg (ref 26.0–34.0)
MCHC: 34.6 g/dL (ref 30.0–36.0)
MCV: 82.2 fL (ref 80.0–100.0)
Platelets: 226 10*3/uL (ref 150–400)
RBC: 5.27 MIL/uL (ref 4.22–5.81)
RDW: 12.5 % (ref 11.5–15.5)
WBC: 6.3 10*3/uL (ref 4.0–10.5)
nRBC: 0 % (ref 0.0–0.2)

## 2022-07-06 LAB — LIPID PANEL
Cholesterol: 164 mg/dL (ref 0–200)
HDL: 25 mg/dL — ABNORMAL LOW (ref >40)
LDL Cholesterol: 73 mg/dL (ref 0–99)
Total CHOL/HDL Ratio: 6.6 RATIO
Triglycerides: 332 mg/dL — ABNORMAL HIGH (ref <150)
VLDL: 66 mg/dL — ABNORMAL HIGH (ref 0–40)

## 2022-07-06 LAB — MAGNESIUM: Magnesium: 1.9 mg/dL (ref 1.7–2.4)

## 2022-07-06 LAB — GLUCOSE, CAPILLARY
Glucose-Capillary: 122 mg/dL — ABNORMAL HIGH (ref 70–99)
Glucose-Capillary: 199 mg/dL — ABNORMAL HIGH (ref 70–99)
Glucose-Capillary: 205 mg/dL — ABNORMAL HIGH (ref 70–99)
Glucose-Capillary: 260 mg/dL — ABNORMAL HIGH (ref 70–99)
Glucose-Capillary: 243 mg/dL — ABNORMAL HIGH (ref 70–99)
Glucose-Capillary: 248 mg/dL — ABNORMAL HIGH (ref 70–99)

## 2022-07-06 LAB — HIV ANTIBODY (ROUTINE TESTING W REFLEX): HIV Screen 4th Generation wRfx: NONREACTIVE

## 2022-07-06 MED ORDER — METFORMIN HCL 500 MG PO TABS
500.0000 mg | ORAL_TABLET | Freq: Two times a day (BID) | ORAL | Status: DC
  Administered 2022-07-06 – 2022-07-08 (×5): 500 mg via ORAL
  Filled 2022-07-06 (×5): qty 1

## 2022-07-06 MED ORDER — POTASSIUM CHLORIDE CRYS ER 20 MEQ PO TBCR
40.0000 meq | EXTENDED_RELEASE_TABLET | Freq: Once | ORAL | Status: AC
  Administered 2022-07-06: 40 meq via ORAL
  Filled 2022-07-06: qty 2

## 2022-07-06 MED ORDER — DOCUSATE SODIUM 100 MG PO CAPS
100.0000 mg | ORAL_CAPSULE | Freq: Two times a day (BID) | ORAL | Status: DC
  Administered 2022-07-06 (×2): 100 mg via ORAL
  Filled 2022-07-06 (×3): qty 1

## 2022-07-06 MED ORDER — INSULIN ASPART 100 UNIT/ML IJ SOLN
0.0000 [IU] | Freq: Three times a day (TID) | INTRAMUSCULAR | Status: DC
  Administered 2022-07-06: 5 [IU] via SUBCUTANEOUS
  Administered 2022-07-06: 8 [IU] via SUBCUTANEOUS
  Administered 2022-07-07 (×2): 5 [IU] via SUBCUTANEOUS
  Administered 2022-07-07: 8 [IU] via SUBCUTANEOUS
  Administered 2022-07-08 (×2): 3 [IU] via SUBCUTANEOUS

## 2022-07-06 MED ORDER — LACTATED RINGERS IV SOLN: INTRAVENOUS | Status: AC

## 2022-07-06 MED ORDER — ALPRAZOLAM 0.5 MG PO TABS
0.5000 mg | ORAL_TABLET | Freq: Once | ORAL | Status: AC
  Administered 2022-07-06: 0.5 mg via ORAL
  Filled 2022-07-06: qty 1

## 2022-07-06 MED ORDER — INSULIN GLARGINE-YFGN 100 UNIT/ML ~~LOC~~ SOLN
20.0000 [IU] | Freq: Every day | SUBCUTANEOUS | Status: DC
  Administered 2022-07-06: 20 [IU] via SUBCUTANEOUS
  Filled 2022-07-06 (×3): qty 0.2

## 2022-07-06 MED ORDER — INSULIN STARTER KIT- PEN NEEDLES (ENGLISH)
1.0000 | Freq: Once | Status: DC
  Filled 2022-07-06: qty 1

## 2022-07-06 MED ORDER — FAMOTIDINE 20 MG PO TABS
20.0000 mg | ORAL_TABLET | Freq: Two times a day (BID) | ORAL | Status: DC
  Administered 2022-07-06 – 2022-07-08 (×5): 20 mg via ORAL
  Filled 2022-07-06 (×5): qty 1

## 2022-07-06 MED ORDER — VALACYCLOVIR HCL 500 MG PO TABS
1000.0000 mg | ORAL_TABLET | Freq: Three times a day (TID) | ORAL | Status: DC
  Administered 2022-07-06 – 2022-07-08 (×7): 1000 mg via ORAL
  Filled 2022-07-06 (×9): qty 2

## 2022-07-06 MED ORDER — HYDROCORTISONE 1 % EX CREA
TOPICAL_CREAM | Freq: Four times a day (QID) | CUTANEOUS | Status: DC
  Filled 2022-07-06: qty 28

## 2022-07-06 MED ORDER — LIVING WELL WITH DIABETES BOOK
Freq: Once | Status: AC
Start: 2022-07-06 — End: 2022-07-06
  Filled 2022-07-06: qty 1

## 2022-07-06 MED ORDER — ROSUVASTATIN CALCIUM 20 MG PO TABS
20.0000 mg | ORAL_TABLET | Freq: Every day | ORAL | Status: DC
  Administered 2022-07-06: 20 mg via ORAL
  Filled 2022-07-06 (×2): qty 1

## 2022-07-06 MED ORDER — TRAMADOL HCL 50 MG PO TABS
50.0000 mg | ORAL_TABLET | Freq: Once | ORAL | Status: AC
  Administered 2022-07-06: 50 mg via ORAL
  Filled 2022-07-06: qty 1

## 2022-07-06 MED ORDER — ACETAMINOPHEN 325 MG PO TABS
650.0000 mg | ORAL_TABLET | Freq: Four times a day (QID) | ORAL | Status: DC | PRN
  Administered 2022-07-06 – 2022-07-07 (×3): 650 mg via ORAL
  Filled 2022-07-06 (×4): qty 2

## 2022-07-06 MED ORDER — INSULIN ASPART 100 UNIT/ML IJ SOLN
0.0000 [IU] | Freq: Every day | INTRAMUSCULAR | Status: DC
  Administered 2022-07-06 – 2022-07-07 (×2): 2 [IU] via SUBCUTANEOUS

## 2022-07-06 NOTE — Inpatient Diabetes Management (Signed)
Inpatient Diabetes Program Recommendations  AACE/ADA: New Consensus Statement on Inpatient Glycemic Control (2015)  Target Ranges:  Prepandial:   less than 140 mg/dL      Peak postprandial:   less than 180 mg/dL (1-2 hours)      Critically ill patients:  140 - 180 mg/dL   Lab Results  Component Value Date   GLUCAP 260 (H) 07/06/2022   HGBA1C 14.9 (H) 07/05/2022    Review of Glycemic Control  Diabetes history: New-onset DM2 Outpatient Diabetes medications: None Current orders for Inpatient glycemic control: Semglee 20 QD, Novolog 0-15 units TID with meals and 0-5 HS, metformin 500 mg BID  New-onset DM with HgbA1C of 14.9% IV insulin per [facility] started at [facility] with 15 units/H.  Inpatient Diabetes Program Recommendations:    Consider adding Novolog 4 units TID with meals if eating > 50%  Ordered Living Well book and insulin pen starter kit  Secure text to RN regarding bedside education regarding new onset DM2. Allow pt to stick finger for CBG checks and give his own insulin when appropriate. Review Living Well book with pt.  Will attempt to speak with pt by phone this afternoon regarding new diagnosis.  Diabetes Coordinator to see on 9/4.  Thank you. Lorenda Peck, RD, LDN, [place] Inpatient Diabetes Coordinator [phone]

## 2022-07-06 NOTE — Progress Notes (Signed)
PROGRESS NOTE                                                                                                                                                                                                             Patient Demographics:    Jeffery Santos, is a 41 y.o. male, DOB - Mar 09, 1981, SFK:[id number]  Outpatient Primary MD for the patient is Pcp, No    LOS - 0  Admit date - 07/05/2022    Chief Complaint  Patient presents with   Fatigue       Brief Narrative (HPI from H&P)   41 y.o. male with medical history significant of PTSD.  Pt presents to ED with c/o polyuria, polydipsia, wt loss.  In the ER he was diagnosed with nonketotic hyperosmolar state with strongly high blood sugar levels.  He was admitted to the hospital on insulin drip for further care.   Subjective:    Jeffery Santos today has, No headache, No chest pain, No abdominal pain - No Nausea, No new weakness tingling or numbness, no SOB.   Assessment  & Plan :    Nonketotic hyperosmolar state due to newly diagnosed DM type II with poorly controlled sugars over 600.  A1c of 14.9.  he is quite dehydrated clinically, he has been treated with IV insulin drip, currently sugars are in better control, we will transition him to Lantus along with sliding scale, add Glucophage twice daily, initiate diabetic and insulin teaching, dietary education.  Monitor sugars and come up with a good outpatient regimen.  We will check a fasting lipid panel and monitor blood pressures closely.  Obesity.  BMI 34.  Follow with PCP for weight loss.         Condition - Fair  Family Communication  :  None present  Code Status :  Full  Consults  :  None  PUD Prophylaxis :    Procedures  :            Disposition Plan  :    Status is: Observation  DVT Prophylaxis  :    enoxaparin (LOVENOX) injection 40 mg Start: 07/06/22 1000    Lab Results  Component Value Date    PLT 226 07/06/2022    Diet :  Diet Order  Diet Carb Modified Fluid consistency: Thin; Room service appropriate? Yes  Diet effective now                    Inpatient Medications  Scheduled Meds:  enoxaparin (LOVENOX) injection  40 mg Subcutaneous Q24H   famotidine  20 mg Oral BID   insulin aspart  0-15 Units Subcutaneous TID WC   insulin aspart  0-5 Units Subcutaneous QHS   insulin glargine-yfgn  20 Units Subcutaneous Daily   metFORMIN  500 mg Oral BID WC   potassium chloride  40 mEq Oral Once   valACYclovir  1,000 mg Oral TID   Continuous Infusions:  lactated ringers     PRN Meds:.acetaminophen, dextrose  Antibiotics  :    Anti-infectives (From admission, onward)    Start     Dose/Rate Route Frequency Ordered Stop   07/06/22 1000  valACYclovir (VALTREX) tablet 1,000 mg        1,000 mg Oral 3 times daily 07/06/22 7616          Time Spent in minutes  30   Susa Raring M.D on 07/06/2022 at 9:18 AM  To page go to [email or web address]   [facility] -  Office  [phone]  See all Orders from today for further details    Objective:   Vitals:   07/06/22 0300 07/06/22 0313 07/06/22 0400 07/06/22 0737  BP: (!) 111/59  139/69 90/61  Pulse: 85  74 67  Resp: 20  16 17   Temp:  98.7 F (37.1 C) 98.1 F (36.7 C)   TempSrc:  Oral Oral   SpO2: 97%  98% 97%  Weight:   115.2 kg   Height:   6' (1.829 m)     Wt Readings from Last 3 Encounters:  07/06/22 115.2 kg  06/10/22 120.2 kg  05/24/22 120.2 kg     Intake/Output Summary (Last 24 hours) at 07/06/2022 0918 Last data filed at 07/06/2022 0327 Gross per 24 hour  Intake 36.75 ml  Output --  Net 36.75 ml     Physical Exam  Awake Alert, No new F.N deficits, Normal affect Pace.AT,PERRAL Supple Neck, No JVD,   Symmetrical Chest wall movement, Good air movement bilaterally, CTAB RRR,No Gallops,Rubs or new Murmurs,  +ve B.Sounds, Abd Soft, No tenderness,   No Cyanosis, Clubbing or edema        Data Review:    CBC Recent Labs  Lab 07/05/22 1932 07/05/22 2301 07/06/22 0542  WBC 6.1  --  6.3  HGB 17.1* 17.3* 15.0  HCT 48.0 51.0 43.3  PLT 261  --  226  MCV 81.1  --  82.2  MCH 28.9  --  28.5  MCHC 35.6  --  34.6  RDW 12.4  --  12.5    Electrolytes Recent Labs  Lab 07/05/22 1932 07/05/22 2230 07/05/22 2301 07/06/22 0542  NA 126*  --  130* 136  K 4.8  --  4.3 3.6  CL 92*  --   --  104  CO2 21*  --   --  27  GLUCOSE 684*  --   --  137*  BUN 14  --   --  8  CREATININE 1.36*  --   --  0.99  CALCIUM 8.2*  --   --  8.1*  AST 24  --   --  21  ALT 28  --   --  29  ALKPHOS 86  --   --  52  BILITOT 0.8  --   --  0.8  ALBUMIN 3.1*  --   --  2.7*  MG  --   --   --  1.9  HGBA1C  --  14.9*  --   --     ------------------------------------------------------------------------------------------------------------------ No results for input(s): "CHOL", "HDL", "LDLCALC", "TRIG", "CHOLHDL", "LDLDIRECT" in the last 72 hours.  Lab Results  Component Value Date   HGBA1C 14.9 (H) 07/05/2022    No results for input(s): "TSH", "T4TOTAL", "T3FREE", "THYROIDAB" in the last 72 hours.  Invalid input(s): "FREET3" ------------------------------------------------------------------------------------------------------------------ ID Labs Recent Labs  Lab 07/05/22 1932 07/06/22 0542  WBC 6.1 6.3  PLT 261 226  CREATININE 1.36* 0.99

## 2022-07-06 NOTE — Discharge Instructions (Signed)
Follow with Primary MD  in 7 days   Get CBC, CMP, Magnesium -  checked next visit within 1 week by Primary MD   Activity: As tolerated with Full fall precautions use walker/cane & assistance as needed  Disposition Home   Diet: Heart Healthy Low Carb.  Accuchecks 4 times/day, Once in AM empty stomach and then before each meal. Log in all results and show them to your Prim.MD in 3 days. If any glucose reading is under 80 or above 300 call your Prim MD immidiately. Follow Low glucose instructions for glucose under 80 as instructed.   Special Instructions: If you have smoked or chewed Tobacco  in the last 2 yrs please stop smoking, stop any regular Alcohol  and or any Recreational drug use.  On your next visit with your primary care physician please Get Medicines reviewed and adjusted.  Please request your Prim.MD to go over all Hospital Tests and Procedure/Radiological results at the follow up, please get all Hospital records sent to your Prim MD by signing hospital release before you go home.  If you experience worsening of your admission symptoms, develop shortness of breath, life threatening emergency, suicidal or homicidal thoughts you must seek medical attention immediately by calling 911 or calling your MD immediately  if symptoms less severe.  You Must read complete instructions/literature along with all the possible adverse reactions/side effects for all the Medicines you take and that have been prescribed to you. Take any new Medicines after you have completely understood and accpet all the possible adverse reactions/side effects.      Carbohydrate Counting For People With Diabetes  Foods with carbohydrates make your blood glucose level go up. Learning how to count carbohydrates can help you control your blood glucose levels. First, identify the foods you eat that contain carbohydrates. Then, using the Foods with Carbohydrates chart, determine about how much carbohydrates are in  your meals and snacks. Make sure you are eating foods with fiber, protein, and healthy fat along with your carbohydrate foods. Foods with Carbohydrates The following table shows carbohydrate foods that have about 15 grams of carbohydrate each. Using measuring cups, spoons, or a food scale when you first begin learning about carbohydrate counting can help you learn about the portion sizes you typically eat. The following foods have 15 grams carbohydrate each:  Grains 1 slice bread (1 ounce)  1 small tortilla (6-inch size)   large bagel (1 ounce)  1/3 cup pasta or rice (cooked)   hamburger or hot dog bun ( ounce)   cup cooked cereal   to  cup ready-to-eat cereal  2 taco shells (5-inch size) Fruit 1 small fresh fruit ( to 1 cup)   medium banana  17 small grapes (3 ounces)  1 cup melon or berries   cup canned or frozen fruit  2 tablespoons dried fruit (blueberries, cherries, cranberries, raisins)   cup unsweetened fruit juice  Starchy Vegetables  cup cooked beans, peas, corn, potatoes/sweet potatoes   large baked potato (3 ounces)  1 cup acorn or butternut squash  Snack Foods 3 to 6 crackers  8 potato chips or 13 tortilla chips ( ounce to 1 ounce)  3 cups popped popcorn  Dairy 3/4 cup (6 ounces) nonfat plain yogurt, or yogurt with sugar-free sweetener  1 cup milk  1 cup plain rice, soy, coconut or flavored almond milk Sweets and Desserts  cup ice cream or frozen yogurt  1 tablespoon jam, jelly, pancake syrup, table sugar, or honey  2 tablespoons light pancake syrup  1 inch square of frosted cake or 2 inch square of unfrosted cake  2 small cookies (2/3 ounce each) or  large cookie  Sometimes you'll have to estimate carbohydrate amounts if you don't know the exact recipe. One cup of mixed foods like soups can have 1 to 2 carbohydrate servings, while some casseroles might have 2 or more servings of carbohydrate. Foods that have less than 20 calories in each serving can be  counted as "free" foods. Count 1 cup raw vegetables, or  cup cooked non-starchy vegetables as "free" foods. If you eat 3 or more servings at one meal, then count them as 1 carbohydrate serving.  Foods without Carbohydrates  Not all foods contain carbohydrates. Meat, some dairy, fats, non-starchy vegetables, and many beverages don't contain carbohydrate. So when you count carbohydrates, you can generally exclude chicken, pork, beef, fish, seafood, eggs, tofu, cheese, butter, sour cream, avocado, nuts, seeds, olives, mayonnaise, water, black coffee, unsweetened tea, and zero-calorie drinks. Vegetables with no or low carbohydrate include green beans, cauliflower, tomatoes, and onions. How much carbohydrate should I eat at each meal?  Carbohydrate counting can help you plan your meals and manage your weight. Following are some starting points for carbohydrate intake at each meal. Work with your registered dietitian nutritionist to find the best range that works for your blood glucose and weight.   To Lose Weight To Maintain Weight  Women 2 - 3 carb servings 3 - 4 carb servings  Men 3 - 4 carb servings 4 - 5 carb servings  Checking your blood glucose after meals will help you know if you need to adjust the timing, type, or number of carbohydrate servings in your meal plan. Achieve and keep a healthy body weight by balancing your food intake and physical activity.  Tips How should I plan my meals?  Plan for half the food on your plate to include non-starchy vegetables, like salad greens, broccoli, or carrots. Try to eat 3 to 5 servings of non-starchy vegetables every day. Have a protein food at each meal. Protein foods include chicken, fish, meat, eggs, or beans (note that beans contain carbohydrate). These two food groups (non-starchy vegetables and proteins) are low in carbohydrate. If you fill up your plate with these foods, you will eat less carbohydrate but still fill up your stomach. Try to limit your  carbohydrate portion to  of the plate.  What fats are healthiest to eat?  Diabetes increases risk for heart disease. To help protect your heart, eat more healthy fats, such as olive oil, nuts, and avocado. Eat less saturated fats like butter, cream, and high-fat meats, like bacon and sausage. Avoid trans fats, which are in all foods that list "partially hydrogenated oil" as an ingredient. What should I drink?  Choose drinks that are not sweetened with sugar. The healthiest choices are water, carbonated or seltzer waters, and tea and coffee without added sugars.  Sweet drinks will make your blood glucose go up very quickly. One serving of soda or energy drink is  cup. It is best to drink these beverages only if your blood glucose is low.  Artificially sweetened, or diet drinks, typically do not increase your blood glucose if they have zero calories in them. Read labels of beverages, as some diet drinks do have carbohydrate and will raise your blood glucose. Label Reading Tips Read Nutrition Facts labels to find out how many grams of carbohydrate are in a food you want  to eat. Don't forget: sometimes serving sizes on the label aren't the same as how much food you are going to eat, so you may need to calculate how much carbohydrate is in the food you are serving yourself.   Carbohydrate Counting for People with Diabetes Sample 1-Day Menu  Breakfast  cup yogurt, low fat, low sugar (1 carbohydrate serving)   cup cereal, ready-to-eat, unsweetened (1 carbohydrate serving)  1 cup strawberries (1 carbohydrate serving)   cup almonds ( carbohydrate serving)  Lunch 1, 5 ounce can chunk light tuna  2 ounces cheese, low fat cheddar  6 whole wheat crackers (1 carbohydrate serving)  1 small apple (1 carbohydrate servings)   cup carrots ( carbohydrate serving)   cup snap peas  1 cup 1% milk (1 carbohydrate serving)   Evening Meal Stir fry made with: 3 ounces chicken  1 cup brown rice (3 carbohydrate  servings)   cup broccoli ( carbohydrate serving)   cup green beans   cup onions  1 tablespoon olive oil  2 tablespoons teriyaki sauce ( carbohydrate serving)  Evening Snack 1 extra small banana (1 carbohydrate serving)  1 tablespoon peanut butter   Carbohydrate Counting for People with Diabetes Vegan Sample 1-Day Menu  Breakfast 1 cup cooked oatmeal (2 carbohydrate servings)   cup blueberries (1 carbohydrate serving)  2 tablespoons flaxseeds  1 cup soymilk fortified with calcium and vitamin D  1 cup coffee  Lunch 2 slices whole wheat bread (2 carbohydrate servings)   cup baked tofu   cup lettuce  2 slices tomato  2 slices avocado   cup baby carrots ( carbohydrate serving)  1 orange (1 carbohydrate serving)  1 cup soymilk fortified with calcium and vitamin D   Evening Meal Burrito made with: 1 6-inch corn tortilla (1 carbohydrate serving)  1 cup refried vegetarian beans (2 carbohydrate servings)   cup chopped tomatoes   cup lettuce   cup salsa  1/3 cup brown rice (1 carbohydrate serving)  1 tablespoon olive oil for rice   cup zucchini   Evening Snack 6 small whole grain crackers (1 carbohydrate serving)  2 apricots ( carbohydrate serving)   cup unsalted peanuts ( carbohydrate serving)    Carbohydrate Counting for People with Diabetes Vegetarian (Lacto-Ovo) Sample 1-Day Menu  Breakfast 1 cup cooked oatmeal (2 carbohydrate servings)   cup blueberries (1 carbohydrate serving)  2 tablespoons flaxseeds  1 egg  1 cup 1% milk (1 carbohydrate serving)  1 cup coffee  Lunch 2 slices whole wheat bread (2 carbohydrate servings)  2 ounces low-fat cheese   cup lettuce  2 slices tomato  2 slices avocado   cup baby carrots ( carbohydrate serving)  1 orange (1 carbohydrate serving)  1 cup unsweetened tea  Evening Meal Burrito made with: 1 6-inch corn tortilla (1 carbohydrate serving)   cup refried vegetarian beans (1 carbohydrate serving)   cup tomatoes    cup lettuce   cup salsa  1/3 cup brown rice (1 carbohydrate serving)  1 tablespoon olive oil for rice   cup zucchini  1 cup 1% milk (1 carbohydrate serving)  Evening Snack 6 small whole grain crackers (1 carbohydrate serving)  2 apricots ( carbohydrate serving)   cup unsalted peanuts ( carbohydrate serving)    Copyright 2020  Academy of Nutrition and Dietetics. All rights reserved.  Using Nutrition Labels: Carbohydrate  Serving Size  Look at the serving size. All the information on the label is based on this portion. Servings  Per Container  The number of servings contained in the package. Guidelines for Carbohydrate  Look at the total grams of carbohydrate in the serving size.  1 carbohydrate choice = 15 grams of carbohydrate. Range of Carbohydrate Grams Per Choice  Carbohydrate Grams/Choice Carbohydrate Choices  6-10   11-20 1  21-25 1  26-35 2  36-40 2  41-50 3  51-55 3  56-65 4  66-70 4  71-80 5    Copyright 2020  Academy of Nutrition and Dietetics. All rights reserved.

## 2022-07-06 NOTE — Progress Notes (Signed)
  RD consulted for nutrition education regarding diabetes.   Lab Results  Component Value Date   HGBA1C 14.9 (H) 07/05/2022   Pt with new onset DM; diabetes coordinator consult pending.   Pt unavailable at time of visit. Attempted to speak with pt via call to hospital room phone, however, unable to reach. RD unable to obtain further nutrition-related history or complete nutrition-focused physical exam at this time.    RD provided "Carbohydrate Counting for People with Diabetes" handout from the Academy of Nutrition and Dietetics. Attached to AVS/ discharge instructions.   RD also referred pt to [facility]'s Nutrition and Diabetes Education Services for further reinforcement.   Current diet order is carb modified, patient is consuming approximately n/a% of meals at this time. Labs and medications reviewed. No further nutrition interventions warranted at this time. RD contact information provided. If additional nutrition issues arise, please re-consult RD.  Levada Schilling, RD, LDN, CDCES Registered Dietitian II Certified Diabetes Care and Education Specialist Please refer to [facility] for RD and/or RD on-call/weekend/after hours pager

## 2022-07-06 NOTE — Plan of Care (Signed)
  Problem: Clinical Measurements: Goal: Will remain free from infection Outcome: Completed/Met Goal: Respiratory complications will improve Outcome: Completed/Met   Problem: Nutrition: Goal: Adequate nutrition will be maintained Outcome: Completed/Met   Problem: Elimination: Goal: Will not experience complications related to urinary retention Outcome: Completed/Met   Problem: Skin Integrity: Goal: Risk for impaired skin integrity will decrease Outcome: Completed/Met

## 2022-07-07 DIAGNOSIS — E1165 Type 2 diabetes mellitus with hyperglycemia: Secondary | ICD-10-CM | POA: Diagnosis not present

## 2022-07-07 LAB — CBC WITH DIFFERENTIAL/PLATELET
Abs Immature Granulocytes: 0.04 10*3/uL (ref 0.00–0.07)
Basophils Absolute: 0 10*3/uL (ref 0.0–0.1)
Basophils Relative: 1 %
Eosinophils Absolute: 0.1 10*3/uL (ref 0.0–0.5)
Eosinophils Relative: 2 %
HCT: 39.4 % (ref 39.0–52.0)
Hemoglobin: 13.6 g/dL (ref 13.0–17.0)
Immature Granulocytes: 1 %
Lymphocytes Relative: 36 %
Lymphs Abs: 1.9 10*3/uL (ref 0.7–4.0)
MCH: 28.5 pg (ref 26.0–34.0)
MCHC: 34.5 g/dL (ref 30.0–36.0)
MCV: 82.6 fL (ref 80.0–100.0)
Monocytes Absolute: 0.4 10*3/uL (ref 0.1–1.0)
Monocytes Relative: 8 %
Neutro Abs: 2.8 10*3/uL (ref 1.7–7.7)
Neutrophils Relative %: 52 %
Platelets: 205 10*3/uL (ref 150–400)
RBC: 4.77 MIL/uL (ref 4.22–5.81)
RDW: 12.5 % (ref 11.5–15.5)
WBC: 5.4 10*3/uL (ref 4.0–10.5)
nRBC: 0 % (ref 0.0–0.2)

## 2022-07-07 LAB — COMPREHENSIVE METABOLIC PANEL
ALT: 35 U/L (ref 0–44)
AST: 23 U/L (ref 15–41)
Albumin: 2.4 g/dL — ABNORMAL LOW (ref 3.5–5.0)
Alkaline Phosphatase: 48 U/L (ref 38–126)
Anion gap: 5 (ref 5–15)
BUN: 7 mg/dL (ref 6–20)
CO2: 25 mmol/L (ref 22–32)
Calcium: 8 mg/dL — ABNORMAL LOW (ref 8.9–10.3)
Chloride: 106 mmol/L (ref 98–111)
Creatinine, Ser: 1.09 mg/dL (ref 0.61–1.24)
GFR, Estimated: >60 mL/min (ref >60)
Glucose, Bld: 259 mg/dL — ABNORMAL HIGH (ref 70–99)
Potassium: 3.7 mmol/L (ref 3.5–5.1)
Sodium: 136 mmol/L (ref 135–145)
Total Bilirubin: 0.8 mg/dL (ref 0.3–1.2)
Total Protein: 4.8 g/dL — ABNORMAL LOW (ref 6.5–8.1)

## 2022-07-07 LAB — GLUCOSE, CAPILLARY
Glucose-Capillary: 263 mg/dL — ABNORMAL HIGH (ref 70–99)
Glucose-Capillary: 249 mg/dL — ABNORMAL HIGH (ref 70–99)
Glucose-Capillary: 224 mg/dL — ABNORMAL HIGH (ref 70–99)
Glucose-Capillary: 236 mg/dL — ABNORMAL HIGH (ref 70–99)

## 2022-07-07 LAB — MAGNESIUM: Magnesium: 1.6 mg/dL — ABNORMAL LOW (ref 1.7–2.4)

## 2022-07-07 MED ORDER — INSULIN GLARGINE-YFGN 100 UNIT/ML ~~LOC~~ SOLN
32.0000 [IU] | Freq: Every day | SUBCUTANEOUS | Status: DC
  Administered 2022-07-07: 32 [IU] via SUBCUTANEOUS
  Filled 2022-07-07 (×2): qty 0.32

## 2022-07-07 MED ORDER — POLYETHYLENE GLYCOL 3350 17 G PO PACK
17.0000 g | PACK | Freq: Two times a day (BID) | ORAL | Status: DC
  Administered 2022-07-07 – 2022-07-08 (×3): 17 g via ORAL
  Filled 2022-07-07 (×3): qty 1

## 2022-07-07 MED ORDER — TRAMADOL HCL 50 MG PO TABS
50.0000 mg | ORAL_TABLET | Freq: Four times a day (QID) | ORAL | Status: DC | PRN
  Administered 2022-07-07: 50 mg via ORAL
  Filled 2022-07-07: qty 1

## 2022-07-07 MED ORDER — POTASSIUM CHLORIDE CRYS ER 20 MEQ PO TBCR
20.0000 meq | EXTENDED_RELEASE_TABLET | Freq: Once | ORAL | Status: AC
  Administered 2022-07-07: 20 meq via ORAL
  Filled 2022-07-07: qty 1

## 2022-07-07 MED ORDER — MELATONIN 5 MG PO TABS
5.0000 mg | ORAL_TABLET | Freq: Every evening | ORAL | Status: DC | PRN
  Administered 2022-07-07: 5 mg via ORAL
  Filled 2022-07-07: qty 1

## 2022-07-07 MED ORDER — DOCUSATE SODIUM 100 MG PO CAPS
200.0000 mg | ORAL_CAPSULE | Freq: Two times a day (BID) | ORAL | Status: DC
  Administered 2022-07-07 – 2022-07-08 (×2): 200 mg via ORAL
  Filled 2022-07-07 (×2): qty 2

## 2022-07-07 MED ORDER — HYDROCORTISONE (PERIANAL) 2.5 % EX CREA
TOPICAL_CREAM | Freq: Four times a day (QID) | CUTANEOUS | Status: DC
  Filled 2022-07-07: qty 28.35

## 2022-07-07 MED ORDER — MAGNESIUM SULFATE 4 GM/100ML IV SOLN
4.0000 g | Freq: Once | INTRAVENOUS | Status: AC
  Administered 2022-07-07: 4 g via INTRAVENOUS
  Filled 2022-07-07: qty 100

## 2022-07-07 MED ORDER — ROSUVASTATIN CALCIUM 5 MG PO TABS
10.0000 mg | ORAL_TABLET | Freq: Every day | ORAL | Status: DC
  Administered 2022-07-08: 10 mg via ORAL
  Filled 2022-07-07: qty 2

## 2022-07-07 NOTE — Inpatient Diabetes Management (Addendum)
Inpatient Diabetes Program Recommendations  AACE/ADA: New Consensus Statement on Inpatient Glycemic Control (2015)  Target Ranges:  Prepandial:   less than 140 mg/dL      Peak postprandial:   less than 180 mg/dL (1-2 hours)      Critically ill patients:  140 - 180 mg/dL    Latest Reference Range & Units 07/05/22 22:30  Hemoglobin A1C 4.8 - 5.6 % 14.9 (H)  380 mg/dl  (H): Data is abnormally high  Latest Reference Range & Units 07/05/22 19:08 07/06/22 00:04 07/06/22 00:32 07/06/22 01:14 07/06/22 02:22 07/06/22 03:24 07/06/22 03:31 07/06/22 05:32 07/06/22 06:42 07/06/22 07:42  Glucose-Capillary 70 - 99 mg/dL 558 (HH) 398 (H)  IV Insulin Drip Running 340 (H) 266 (H) 225 (H) 195 (H) 186 (H) 122 (H) 199 (H) 205 (H)  20 units [place] _0     Latest Reference Range & Units 07/06/22 10:44 07/06/22 16:12 07/06/22 20:12  Glucose-Capillary 70 - 99 mg/dL 260 (H)  IV Insulin Drip Stopped _1   8 units Novolog  243 (H)  5 units Novolog  248 (H)  2 units Novolog   (H): Data is abnormally high  Latest Reference Range & Units 07/07/22 06:10  Glucose-Capillary 70 - 99 mg/dL 263 (H)  8 units Novolog   (H): Data is abnormally high    Admit with:  Hyperglycemia New diagnosis Diabetes   Current Orders:  Semglee 32 units Daily Novolog Moderate Correction Scale/ SSI (0-15 units) TID AC + HS Metformin 500 mg BID    MD- Looks like Lantus Insulin Pen and Humalog Insulin pen are both covered by pt's Insurance plan  Lantus Insulin Pen- order # [id number] Humalog Insulin Pen- order # [id number] Insulin Pen Needles- order # [id number] CBG meter Kit and Supplies- order # [id number]   Addendum 9:45am--Met w/ pt at bedside to discuss new diagnosis diabetes.  Spent about 1 hour with pt today reviewing diabetes info and answering questions.  Pt told me he established with PCP ([facility] in [place], [place]) back in Feb.  Pt told me the PCP asked him to come back for lab work but he  never went back.  Began having hyperglycemia symptoms at home about 2 weeks prior to admission.  Encouraged pt to make a follow up appt with his new PCP and to also consider getting a referral for an Endocrinologist for further diabetes management.    Spoke with pt about new diagnosis.  Discussed A1C results with him and explained what an A1C is, basic pathophysiology of DM Type 2, basic home care, basic diabetes diet nutrition principles, importance of checking CBGs and maintaining good CBG control to prevent long-term and short-term complications.  Reviewed signs and symptoms of hyperglycemia and hypoglycemia and how to treat hypoglycemia at home.  Also reviewed blood sugar goals and A1c goals for home.  Also discussed DM diet information with patient.  Encouraged patient to avoid beverages with sugar (regular soda, sweet tea, lemonade, fruit juice) and to consume mostly water.  Discussed what foods contain carbohydrates and how carbohydrates affect the body's blood sugar levels.  Encouraged patient to be careful with his portion sizes (especially grains, starchy vegetables, and fruits).  Explained to patient that men should have 60-75 grams of carbohydrates per meal per day.  Encouraged pt to review important websites like the American Diabetes Association for further info on diabetes and diabetes diet.    Educated patient on insulin pen use at home.  Reviewed contents of insulin flexpen  starter kit.  Reviewed all steps of insulin pen including attachment of needle, 2-unit air shot, dialing up dose, giving injection, rotation of injection sites, removing needle, disposal of sharps, storage of unused insulin, disposal of insulin etc.  Patient able to provide successful return demonstration.  Reviewed troubleshooting with insulin pen.  Also reviewed Signs/Symptoms of Hypoglycemia with patient and how to treat Hypoglycemia at home.  Pt asked me if he will have to take insulin the rest of his life--Explained to  pt that if he makes better nutrition  choices, loses weight, moves more, and controls his CBGs with any meds prescribed that it is possible to reduce and/or eliminate diabetes meds down the road, however, he needs to work closely with his PCP and be seen on regular basis to review CBGs and medications.    Have asked RNs caring for patient to please allow patient to give all injections here in hospital as much as possible for practice.  MD to give patient Rxs for insulin pens and insulin pen needles.     RNs to provide ongoing basic DM education at bedside with this patient.  Have ordered educational booklet, insulin starter kit.  RD also consulted for DM diet education for this patient.     --Will follow patient during hospitalization--  Wyn Quaker RN, MSN, [place] Diabetes Coordinator Inpatient Glycemic Control Team Team Pager: [phone] (8a-5p)

## 2022-07-07 NOTE — Progress Notes (Signed)
PROGRESS NOTE                                                                                                                                                                                                             Patient Demographics:    Jeffery Santos, is a 41 y.o. male, DOB - [birth year], IHK:[id number]  Outpatient Primary MD for the patient is Pcp, No    LOS - 1  Admit date - 07/05/2022    Chief Complaint  Patient presents with   Fatigue       Brief Narrative (HPI from H&P)   41 y.o. male with medical history significant of PTSD.  Pt presents to ED with c/o polyuria, polydipsia, wt loss.  In the ER he was diagnosed with nonketotic hyperosmolar state with strongly high blood sugar levels.  He was admitted to the hospital on insulin drip for further care.   Subjective:   Patient in bed, appears comfortable, denies any headache, no fever, no chest pain or pressure, no shortness of breath , no abdominal pain. No new focal weakness.   Assessment  & Plan :    Nonketotic hyperosmolar state due to newly diagnosed DM type II with poorly controlled sugars over 600.  A1c of 14.9.  he is quite dehydrated clinically, he has been treated with IV insulin drip, currently sugars are in better control, we will transition him to Lantus along with sliding scale, add Glucophage twice daily, initiate diabetic and insulin teaching, dietary education.  Insulin dose adjusted further on 07/07/2022, monitor sugars and come up with a good outpatient regimen.    Dyslipidemia.  Elevated triglycerides and mildly elevated LDL , above goal when accounting for CAD: DM type II.  Placed on 10 mg of Crestor.  Lab Results  Component Value Date   CHOL 164 07/06/2022   HDL 25 (L) 07/06/2022   LDLCALC 73 07/06/2022   TRIG 332 (H) 07/06/2022   CHOLHDL 6.6 07/06/2022    CBG (last 3)  Recent Labs    07/06/22 1612 07/06/22 2012 07/07/22 0610  GLUCAP  243* 248* 263*    Obesity.  BMI 34.  Follow with PCP for weight loss.  Rectal pain after a hard stool.  Placed on stool softeners, MiraLAX, Anusol cream.      Condition - Fair  Family Communication  :  None present  Code Status :  Full  Consults  :  None  PUD Prophylaxis :    Procedures  :            Disposition Plan  :    Status is: Observation  DVT Prophylaxis  :    enoxaparin (LOVENOX) injection 40 mg Start: 07/06/22 1000    Lab Results  Component Value Date   PLT 205 07/07/2022    Diet :  Diet Order             Diet Carb Modified Fluid consistency: Thin; Room service appropriate? Yes  Diet effective now                    Inpatient Medications  Scheduled Meds:  docusate sodium  200 mg Oral BID   enoxaparin (LOVENOX) injection  40 mg Subcutaneous Q24H   famotidine  20 mg Oral BID   hydrocortisone   Rectal QID   insulin aspart  0-15 Units Subcutaneous TID WC   insulin aspart  0-5 Units Subcutaneous QHS   insulin glargine-yfgn  32 Units Subcutaneous Daily   insulin starter kit- pen needles  1 kit Other Once   metFORMIN  500 mg Oral BID WC   polyethylene glycol  17 g Oral BID   potassium chloride  20 mEq Oral Once   rosuvastatin  20 mg Oral Daily   valACYclovir  1,000 mg Oral TID   Continuous Infusions:   PRN Meds:.acetaminophen, dextrose, melatonin, traMADol  Antibiotics  :    Anti-infectives (From admission, onward)    Start     Dose/Rate Route Frequency Ordered Stop   07/06/22 1000  valACYclovir (VALTREX) tablet 1,000 mg        1,000 mg Oral 3 times daily 07/06/22 0932          Time Spent in minutes  30   Lala Lund M.D on 07/07/2022 at 10:49 AM  To page go to [email or web address]   [facility] -  Office  [phone]  See all Orders from today for further details    Objective:   Vitals:   07/06/22 1908 07/07/22 0107 07/07/22 0507 07/07/22 0509  BP: 126/64 111/68 111/74 111/74  Pulse: 84 64 61   Resp: $Remo'18 18 18    'KHXag$ Temp: 99.4 F (37.4 C) 98.4 F (36.9 C) 98.8 F (37.1 C)   TempSrc: Oral Oral Oral   SpO2: 96% 97% 98% 91%  Weight:    115.8 kg  Height:        Wt Readings from Last 3 Encounters:  07/07/22 115.8 kg  06/10/22 120.2 kg  05/24/22 120.2 kg     Intake/Output Summary (Last 24 hours) at 07/07/2022 1049 Last data filed at 07/07/2022 0825 Gross per 24 hour  Intake 1263.04 ml  Output 700 ml  Net 563.04 ml     Physical Exam  Awake Alert, No new F.N deficits, Normal affect [place].AT,PERRAL Supple Neck, No JVD,   Symmetrical Chest wall movement, Good air movement bilaterally, CTAB RRR,No Gallops, Rubs or new Murmurs,  +ve B.Sounds, Abd Soft, No tenderness,   No Cyanosis, Clubbing or edema       Data Review:    CBC Recent Labs  Lab 07/05/22 1932 07/05/22 2301 07/06/22 0542 07/07/22 0232  WBC 6.1  --  6.3 5.4  HGB 17.1* 17.3* 15.0 13.6  HCT 48.0 51.0 43.3 39.4  PLT 261  --  226 205  MCV 81.1  --  82.2 82.6  MCH 28.9  --  28.5 28.5  MCHC 35.6  --  34.6 34.5  RDW 12.4  --  12.5 12.5  LYMPHSABS  --   --   --  1.9  MONOABS  --   --   --  0.4  EOSABS  --   --   --  0.1  BASOSABS  --   --   --  0.0    Electrolytes Recent Labs  Lab 07/05/22 1932 07/05/22 2230 07/05/22 2301 07/06/22 0542 07/07/22 0232  NA 126*  --  130* 136 136  K 4.8  --  4.3 3.6 3.7  CL 92*  --   --  104 106  CO2 21*  --   --  27 25  GLUCOSE 684*  --   --  137* 259*  BUN 14  --   --  8 7  CREATININE 1.36*  --   --  0.99 1.09  CALCIUM 8.2*  --   --  8.1* 8.0*  AST 24  --   --  21 23  ALT 28  --   --  29 35  ALKPHOS 86  --   --  52 48  BILITOT 0.8  --   --  0.8 0.8  ALBUMIN 3.1*  --   --  2.7* 2.4*  MG  --   --   --  1.9 1.6*  HGBA1C  --  14.9*  --   --   --     ------------------------------------------------------------------------------------------------------------------ Recent Labs    07/06/22 0610  CHOL 164  HDL 25*  LDLCALC 73  TRIG 332*  CHOLHDL 6.6    Lab Results   Component Value Date   HGBA1C 14.9 (H) 07/05/2022    No results for input(s): "TSH", "T4TOTAL", "T3FREE", "THYROIDAB" in the last 72 hours.  Invalid input(s): "FREET3" ------------------------------------------------------------------------------------------------------------------ ID Labs Recent Labs  Lab 07/05/22 1932 07/06/22 0542 07/07/22 0232  WBC 6.1 6.3 5.4  PLT 261 226 205  CREATININE 1.36* 0.99 1.09

## 2022-07-08 ENCOUNTER — Other Ambulatory Visit (HOSPITAL_COMMUNITY): Payer: Self-pay

## 2022-07-08 DIAGNOSIS — E1165 Type 2 diabetes mellitus with hyperglycemia: Secondary | ICD-10-CM | POA: Diagnosis not present

## 2022-07-08 LAB — MAGNESIUM: Magnesium: 1.7 mg/dL (ref 1.7–2.4)

## 2022-07-08 LAB — GLUCOSE, CAPILLARY
Glucose-Capillary: 131 mg/dL — ABNORMAL HIGH (ref 70–99)
Glucose-Capillary: 197 mg/dL — ABNORMAL HIGH (ref 70–99)
Glucose-Capillary: 178 mg/dL — ABNORMAL HIGH (ref 70–99)
Glucose-Capillary: 306 mg/dL — ABNORMAL HIGH (ref 70–99)

## 2022-07-08 MED ORDER — INSULIN LISPRO 100 UNIT/ML IJ SOLN
INTRAMUSCULAR | 0 refills | Status: DC
  Filled 2022-07-08: qty 10, 30d supply, fill #0
  Filled 2022-07-11: qty 10, 30d supply, fill #1

## 2022-07-08 MED ORDER — ROSUVASTATIN CALCIUM 10 MG PO TABS
10.0000 mg | ORAL_TABLET | Freq: Every day | ORAL | 0 refills | Status: DC
Start: 2022-07-08 — End: 2022-10-06
  Filled 2022-07-08: qty 30, 30d supply, fill #0

## 2022-07-08 MED ORDER — FINGERSTIX LANCETS MISC
0 refills | Status: DC
  Filled 2022-07-08: qty 100, 25d supply, fill #0

## 2022-07-08 MED ORDER — HYDROCORTISONE (PERIANAL) 2.5 % EX CREA
TOPICAL_CREAM | Freq: Four times a day (QID) | CUTANEOUS | 0 refills | Status: DC
  Filled 2022-07-08: qty 30, 30d supply, fill #0

## 2022-07-08 MED ORDER — METFORMIN HCL 500 MG PO TABS
500.0000 mg | ORAL_TABLET | Freq: Two times a day (BID) | ORAL | 0 refills | Status: DC
  Filled 2022-07-08: qty 60, 30d supply, fill #0

## 2022-07-08 MED ORDER — DOCUSATE SODIUM 100 MG PO CAPS
200.0000 mg | ORAL_CAPSULE | Freq: Two times a day (BID) | ORAL | 0 refills | Status: DC | PRN
  Filled 2022-07-08: qty 20, 5d supply, fill #0

## 2022-07-08 MED ORDER — GLUCOSE BLOOD VI STRP
ORAL_STRIP | 0 refills | Status: DC
  Filled 2022-07-08: qty 100, 25d supply, fill #0

## 2022-07-08 MED ORDER — BLOOD GLUCOSE MONITOR KIT
PACK | 0 refills | Status: DC
  Filled 2022-07-08: qty 1, 30d supply, fill #0

## 2022-07-08 MED ORDER — INSULIN GLARGINE-YFGN 100 UNIT/ML ~~LOC~~ SOPN
40.0000 [IU] | PEN_INJECTOR | Freq: Every day | SUBCUTANEOUS | 0 refills | Status: DC
  Filled 2022-07-08: qty 12, 30d supply, fill #0

## 2022-07-08 MED ORDER — "INSULIN SYRINGE-NEEDLE U-100 30G X 1/2"" 0.3 ML MISC"
0 refills | Status: DC
  Filled 2022-07-08: qty 30, 30d supply, fill #0

## 2022-07-08 MED ORDER — INSULIN GLARGINE-YFGN 100 UNIT/ML ~~LOC~~ SOLN
40.0000 [IU] | Freq: Every day | SUBCUTANEOUS | Status: DC
  Administered 2022-07-08: 40 [IU] via SUBCUTANEOUS
  Filled 2022-07-08: qty 0.4

## 2022-07-08 MED ORDER — INSULIN PEN NEEDLE 32G X 4 MM MISC
1.0000 | Freq: Once | 0 refills | Status: AC
  Filled 2022-07-08: qty 100, 25d supply, fill #0

## 2022-07-08 NOTE — Inpatient Diabetes Management (Signed)
Inpatient Diabetes Program Recommendations  AACE/ADA: New Consensus Statement on Inpatient Glycemic Control (2015)  Target Ranges:  Prepandial:   less than 140 mg/dL      Peak postprandial:   less than 180 mg/dL (1-2 hours)      Critically ill patients:  140 - 180 mg/dL   Lab Results  Component Value Date   GLUCAP 178 (H) 07/08/2022   HGBA1C 14.9 (H) 07/05/2022   Spoke with pt at bedside to review Diabetes education again and show pt how to operate the insulin pen. Reviewed all steps if insulin pen including attachment of needle, 2-unit air shot, dialing up dose, giving injection, removing needle, disposal of sharps, storage of unused insulin, disposal of insulin etc. Patient able to provide successful return demonstration. Also reviewed troubleshooting with insulin pen.   Thanks,  Christena Deem RN, MSN, BC-ADM Inpatient Diabetes Coordinator Team Pager [phone] (8a-5p)

## 2022-07-08 NOTE — TOC Transition Note (Addendum)
Transition of Care [facility]) - CM/SW Discharge Note   Patient Details  Name: Jeffery Santos MRN: [id number] Date of Birth: Mar 17, 1981  Transition of Care [facility]) CM/SW Contact:  Leone Haven, RN Phone Number: 07/08/2022, 10:45 AM   Clinical Narrative:    Patient is for dc today, he states he has transportation to get home.  He states he is very nervous about his new DM dx,  NCM checked with the diabetes educator to make sure he is signed up for outpatient diabetes education she states he is .  NCM gave him the phone number to call as well [phone]. NCM contacted the outpatient clinic they will not be able to see him til Sept 11,  NCM asked MD for [facility] order so that patient would be able to have some follow up till he goes to the outpatient DM clinic. NCM offeredc choice, patient does not have a preference. NCM made referral to Brandi with [facility].  Awaiting call back. NCM received call back from [place] with [facility], she states they can take referral for [facility].  Soc will begin 24 to 48 hrs post dc.          Patient Goals and CMS Choice        Discharge Placement                       Discharge Plan and Services                                     Social Determinants of Health (SDOH) Interventions     Readmission Risk Interventions     No data to display

## 2022-07-08 NOTE — Progress Notes (Signed)
Patient educated on drawing up insulin and using insulin needles. Successfully demonstrated by patient with teach back.

## 2022-07-08 NOTE — Discharge Summary (Signed)
Jeffery Santos HCW:[id number] DOB: Dec 15, 1980 DOA: 07/05/2022  PCP: Pcp, No  Admit date: 07/05/2022  Discharge date: 07/08/2022  Admitted From: Home   Disposition:  Home   Recommendations for Outpatient Follow-up:   Follow up with PCP in 1-2 weeks  PCP Please obtain BMP/CBC, 2 view CXR in 1week,  (see Discharge instructions)   PCP Please follow up on the following pending results:    Home Health: None   Equipment/Devices: None  Consultations: None  Discharge Condition: Stable    CODE STATUS: Full    Diet Recommendation: Heart Healthy Low Carb     Chief Complaint  Patient presents with   Fatigue     Brief history of present illness from the day of admission and additional interim summary    41 y.o. male with medical history significant of PTSD.  Pt presents to ED with c/o polyuria, polydipsia, wt loss.  In the ER he was diagnosed with nonketotic hyperosmolar state with strongly high blood sugar levels.  He was admitted to the hospital on insulin drip for further care.                                                                 Hospital Course   Nonketotic hyperosmolar state due to newly diagnosed DM type II with poorly controlled sugars over 600.  A1c of 14.9.  he also extremely dehydrated has been hydrated appropriately with IV fluids, has been placed on long-acting insulin along with sliding scale, diabetic and dietary education provided, testing supplies provided, 1 month supply of insulin given, follow-up with PCP postdischarge, requested to check CBGs q. [facility] and present the results to PCP next visit..  Lab Results  Component Value Date   HGBA1C 14.9 (H) 07/05/2022   CBG (last 3)  Recent Labs    07/07/22 1626 07/07/22 2109 07/08/22 0633  GLUCAP 224* 236* 197*    Dyslipidemia with  hypertriglyceridemia.  Placed on Crestor.  PCP to monitor.    Obesity.  BMI 34.  Follow with PCP for weight loss.   Rectal pain after a hard stool.  Placed on stool softeners, MiraLAX, Anusol cream.  Much improved.  PCP to monitor.   Discharge diagnosis     Principal Problem:   Hyperglycemia due to diabetes mellitus (HCC) Active Problems:   PTSD (post-traumatic stress disorder)    Discharge instructions    Discharge Instructions     Amb Referral to Nutrition and Diabetic Education   Complete by: As directed    Discharge instructions   Complete by: As directed    Follow with Primary MD  in 7 days   Get CBC, CMP, Magnesium -  checked next visit within 1 week by Primary MD   Activity: As tolerated with Full fall precautions use walker/cane &  assistance as needed  Disposition Home   Diet: Heart Healthy Low Carb.  Accuchecks 4 times/day, Once in AM empty stomach and then before each meal. Log in all results and show them to your Prim.MD in 3 days. If any glucose reading is under 80 or above 300 call your Prim MD immidiately. Follow Low glucose instructions for glucose under 80 as instructed.   Special Instructions: If you have smoked or chewed Tobacco  in the last 2 yrs please stop smoking, stop any regular Alcohol  and or any Recreational drug use.  On your next visit with your primary care physician please Get Medicines reviewed and adjusted.  Please request your Prim.MD to go over all Hospital Tests and Procedure/Radiological results at the follow up, please get all Hospital records sent to your Prim MD by signing hospital release before you go home.  If you experience worsening of your admission symptoms, develop shortness of breath, life threatening emergency, suicidal or homicidal thoughts you must seek medical attention immediately by calling 911 or calling your MD immediately  if symptoms less severe.  You Must read complete instructions/literature along with all  the possible adverse reactions/side effects for all the Medicines you take and that have been prescribed to you. Take any new Medicines after you have completely understood and accpet all the possible adverse reactions/side effects.   Increase activity slowly   Complete by: As directed        Discharge Medications   Allergies as of 07/08/2022       Reactions   Hydrocodone-acetaminophen Nausea And Vomiting        Medication List     STOP taking these medications    famotidine 20 MG tablet Commonly known as: PEPCID   guaiFENesin 600 MG 12 hr tablet Commonly known as: Mucinex   ibuprofen 800 MG tablet Commonly known as: ADVIL   valACYclovir 1000 MG tablet Commonly known as: VALTREX       TAKE these medications    blood glucose meter kit and supplies Kit Dispense based on patient and insurance preference. Use up to four times daily as directed. (FOR ICD-9 250.00, 250.01). For QAC - HS accuchecks.   buPROPion 75 MG tablet Commonly known as: WELLBUTRIN Take 75 mg by mouth 2 (two) times daily.   docusate sodium 100 MG capsule Commonly known as: COLACE Take 2 capsules (200 mg total) by mouth 2 (two) times daily as needed for mild constipation.   hydrocortisone 2.5 % rectal cream Commonly known as: ANUSOL-HC Place rectally 4 (four) times daily.   insulin aspart 100 UNIT/ML FlexPen Commonly known as: NOVOLOG Before each meal 3 times a day, 140-199 - 2 units, 200-250 - 4 units, 251-299 - 6 units,  300-349 - 8 units,  350 or above 10 units.  Insulin PEN if approved, provide syringes and needles if needed.   insulin glargine 100 UNIT/ML injection Commonly known as: Semglee Inject 0.4 mLs (40 Units total) into the skin daily.   insulin starter kit- pen needles Misc 1 kit by Other route once for 1 dose.   ketoconazole 2 % cream Commonly known as: NIZORAL Apply 1 Application topically daily.   metFORMIN 500 MG tablet Commonly known as: GLUCOPHAGE Take 1 tablet  (500 mg total) by mouth 2 (two) times daily with a meal.   prazosin 2 MG capsule Commonly known as: MINIPRESS Take 2 mg by mouth at bedtime.   rosuvastatin 10 MG tablet Commonly known as: CRESTOR Take 1 tablet (10  mg total) by mouth daily.   traZODone 100 MG tablet Commonly known as: DESYREL Take 50-100 mg by mouth daily as needed for sleep.         Follow-up Information     [place]. Schedule an appointment as soon as possible for a visit in 1 week(s).   Why: Call to schedule appointment for primary care Physican Contact information: [place] [phone]                Major procedures and Radiology Reports - PLEASE review detailed and final reports thoroughly  -        No results found.  Micro Results    No results found for this or any previous visit (from the past 240 hour(s)).  Today   Subjective    Jeffery Santos today has no headache,no chest abdominal pain,no new weakness tingling or numbness, feels much better wants to go home today.    Objective   Blood pressure 119/63, pulse 78, temperature 98.9 F (37.2 C), temperature source Oral, resp. rate 20, height 6' (1.829 m), weight 114.3 kg, SpO2 96 %.   Intake/Output Summary (Last 24 hours) at 07/08/2022 0834 Last data filed at 07/08/2022 0748 Gross per 24 hour  Intake 720 ml  Output 800 ml  Net -80 ml    Exam  Awake Alert, No new F.N deficits,    [name].AT,PERRAL Supple Neck,   Symmetrical Chest wall movement, Good air movement bilaterally, CTAB RRR,No Gallops,   +ve B.Sounds, Abd Soft, Non tender,  No Cyanosis, Clubbing or edema    Data Review   Recent Labs  Lab 07/05/22 1932 07/05/22 2301 07/06/22 0542 07/07/22 0232  WBC 6.1  --  6.3 5.4  HGB 17.1* 17.3* 15.0 13.6  HCT 48.0 51.0 43.3 39.4  PLT 261  --  226 205  MCV 81.1  --  82.2 82.6  MCH 28.9  --  28.5 28.5  MCHC 35.6  --  34.6 34.5   RDW 12.4  --  12.5 12.5  LYMPHSABS  --   --   --  1.9  MONOABS  --   --   --  0.4  EOSABS  --   --   --  0.1  BASOSABS  --   --   --  0.0    Recent Labs  Lab 07/05/22 1932 07/05/22 2230 07/05/22 2301 07/06/22 0542 07/07/22 0232 07/08/22 0234  NA 126*  --  130* 136 136  --   K 4.8  --  4.3 3.6 3.7  --   CL 92*  --   --  104 106  --   CO2 21*  --   --  27 25  --   GLUCOSE 684*  --   --  137* 259*  --   BUN 14  --   --  8 7  --   CREATININE 1.36*  --   --  0.99 1.09  --   CALCIUM 8.2*  --   --  8.1* 8.0*  --   AST 24  --   --  21 23  --   ALT 28  --   --  29 35  --   ALKPHOS 86  --   --  52 48  --   BILITOT 0.8  --   --  0.8 0.8  --   ALBUMIN 3.1*  --   --  2.7* 2.4*  --   MG  --   --   --  1.9 1.6* 1.7  HGBA1C  --  14.9*  --   --   --   --     Total Time in preparing paper work, data evaluation and todays exam - 74 minutes  Lala Lund M.D on 07/08/2022 at 8:34 AM  [facility]

## 2022-07-08 NOTE — TOC Transition Note (Signed)
Transition of Care [facility]) - CM/SW Discharge Note   Patient Details  Name: Jeffery Santos MRN: [id number] Date of Birth: [birth year]  Transition of Care [facility]) CM/SW Contact:  Leone Haven, RN Phone Number: 07/08/2022, 11:29 AM   Clinical Narrative:    Patient is for dc today, he states he has transportation to get home.  He states he is very nervous about his new DM dx,  NCM checked with the diabetes educator to make sure he is signed up for outpatient diabetes education she states he is .  NCM gave him the phone number to call as well [phone]. NCM contacted the outpatient clinic they will not be able to see him til Sept 11,  NCM asked MD for [facility] order so that patient would be able to have some follow up till he goes to the outpatient DM clinic. NCM offeredc choice, patient does not have a preference. NCM made referral to Brandi with [facility].  Awaiting call back. NCM received call back from [place] with [facility], she states they can take referral for [facility].  Soc will begin 24 to 48 hrs post dc.      Final next level of care: Home w Home Health Services Barriers to Discharge: No Barriers Identified   Patient Goals and CMS Choice Patient states their goals for this hospitalization and ongoing recovery are:: home with [facility] CMS Medicare.gov Compare Post Acute Care list provided to:: Patient Choice offered to / list presented to : Patient  Discharge Placement                       Discharge Plan and Services   Discharge Planning Services: CM Consult Post Acute Care Choice: Home Health            DME Agency: NA       HH Arranged: RN HH Agency: [facility] Date [facility] Agency Contacted: 07/08/22 Time HH Agency Contacted: 1128 Representative spoke with at [facility] Agency: Tresa Endo  Social Determinants of Health (SDOH) Interventions     Readmission Risk Interventions     No data to display

## 2022-07-09 ENCOUNTER — Ambulatory Visit: Payer: Self-pay | Admitting: *Deleted

## 2022-07-09 NOTE — Telephone Encounter (Signed)
  Chief Complaint: new diabetic medication question Symptoms: blood glucose now 209. Patient forgot to take dose of metformin 500 mg this am with meal. Did take ss insulin as directed. Wants to know if he should take medication now and again at dinner. Frequency: na Pertinent Negatives: Patient denies dizziness. Frequent urination. Headaches  Disposition: [] ED /[] Urgent Care (no appt availability in office) / [] Appointment(In office/virtual)/ []  [place] [facility]/ [] Home Care/ [] Refused Recommended Disposition /[] [place] [facility]/ [x]  Follow-up with PCP Additional Notes:   Patient scheduled hospital f/u 08/01/22. Recommended patient take insulin as directed for lunch and take metformin 500 mg with evening meal as directed. Call back if needed.     Reason for Disposition  [1] Caller has medicine question about med NOT prescribed by PCP AND [2] triager unable to answer question (e.g., compatibility with other med, storage)  Answer Assessment - Initial Assessment Questions 1. NAME of MEDICINE: "What medicine(s) are you calling about?"     Metformin 500 mg 2 times daily with meals 2. QUESTION: "What is your question?" (e.g., double dose of medicine, side effect)     Missed morning dose of metformin and wants to know if he should take it now  3. PRESCRIBER: "Who prescribed the medicine?" Reason: if prescribed by specialist, call should be referred to that group.     ED 4. SYMPTOMS: "Do you have any symptoms?" If Yes, ask: "What symptoms are you having?"  "How bad are the symptoms (e.g., mild, moderate, severe)     Blood glucose 209 5. PREGNANCY:  "Is there any chance that you are pregnant?" "When was your last menstrual period?"     na  Protocols used: Medication Question Call-A-AH

## 2022-07-09 NOTE — Telephone Encounter (Signed)
Noted  

## 2022-07-11 ENCOUNTER — Other Ambulatory Visit (HOSPITAL_COMMUNITY): Payer: Self-pay

## 2022-07-16 ENCOUNTER — Ambulatory Visit: Payer: PRIVATE HEALTH INSURANCE | Admitting: Nutrition

## 2022-07-22 ENCOUNTER — Ambulatory Visit: Payer: PRIVATE HEALTH INSURANCE

## 2022-07-29 ENCOUNTER — Ambulatory Visit: Payer: PRIVATE HEALTH INSURANCE

## 2022-08-01 ENCOUNTER — Inpatient Hospital Stay: Payer: PRIVATE HEALTH INSURANCE | Admitting: Internal Medicine

## 2022-08-19 ENCOUNTER — Telehealth: Payer: Self-pay

## 2022-08-19 NOTE — Telephone Encounter (Signed)
Pt calling, pt is needing glucometer and supplies refilled because he is out of supplies. Advised pt that when he was dc'd on 07/08/22 from [facility] his rxs were sent to [place]. Called and spoke with tech who states pt needs to call normal pharmacy and get medications transferred so he can pu from there. Advised pt of this information and advised him to call [facility] and give them [facility] TOC # to get rxs transferred. Pt verbalized understanding.

## 2022-08-20 ENCOUNTER — Other Ambulatory Visit (HOSPITAL_COMMUNITY): Payer: Self-pay

## 2022-08-21 ENCOUNTER — Other Ambulatory Visit: Payer: Self-pay

## 2022-08-21 ENCOUNTER — Other Ambulatory Visit (HOSPITAL_COMMUNITY): Payer: Self-pay

## 2022-08-21 ENCOUNTER — Ambulatory Visit: Payer: Self-pay

## 2022-08-21 NOTE — Telephone Encounter (Signed)
Pt called and asking for refills for diabetic supplies. Currently does not have a PCP. See note from 08/19/22. Referred to [facility] to be seen and for orders for diabetic supplies.  Noted 2 days ago that he spoke to NT who advised him to call [facility] pharmacy to have meds tranferred there.  Reason for Disposition  Caller with prescription and triager answers question  Answer Assessment - Initial Assessment Questions 1. DRUG NAME: "What medicine do you need to have refilled?"     Diabetic supplies 2. REFILLS REMAINING: "How many refills are remaining?" (Note: The label on the medicine or pill bottle will show how many refills are remaining. If there are no refills remaining, then a renewal may be needed.)     N/a 3. EXPIRATION DATE: "What is the expiration date?" (Note: The label states when the prescription will expire, and thus can no longer be refilled.)     N/a 4. PRESCRIBING HCP: "Who prescribed it?" Reason: If prescribed by specialist, call should be referred to that group.     At hospital discharge 5. SYMPTOMS: "Do you have any symptoms?"     N/a 6. PREGNANCY: "Is there any chance that you are pregnant?" "When was your last menstrual period?"     N/a  Protocols used: Medication Refill and Renewal Call-A-AH

## 2022-09-03 ENCOUNTER — Other Ambulatory Visit (HOSPITAL_COMMUNITY): Payer: Self-pay

## 2022-09-03 ENCOUNTER — Other Ambulatory Visit: Payer: Self-pay

## 2022-09-08 ENCOUNTER — Encounter: Payer: Self-pay | Admitting: Dietician

## 2022-09-08 ENCOUNTER — Encounter: Payer: PRIVATE HEALTH INSURANCE | Attending: Internal Medicine | Admitting: Dietician

## 2022-09-08 VITALS — Ht 71.0 in | Wt 264.0 lb

## 2022-09-08 DIAGNOSIS — E119 Type 2 diabetes mellitus without complications: Secondary | ICD-10-CM | POA: Insufficient documentation
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
## 2022-09-08 NOTE — Patient Instructions (Addendum)
Great job on changes made! Keep up the good work! Get back to exercising Half your plate non-starchy vegetables

## 2022-09-12 ENCOUNTER — Other Ambulatory Visit: Payer: Self-pay

## 2022-09-12 ENCOUNTER — Encounter (HOSPITAL_COMMUNITY): Payer: Self-pay

## 2022-09-12 ENCOUNTER — Emergency Department (HOSPITAL_COMMUNITY)
Admission: EM | Admit: 2022-09-12 | Discharge: 2022-09-12 | Disposition: A | Discharge status: Home / Self Care | Payer: PRIVATE HEALTH INSURANCE | Attending: Emergency Medicine | Admitting: Emergency Medicine

## 2022-09-12 DIAGNOSIS — Z20822 Contact with and (suspected) exposure to covid-19: Secondary | ICD-10-CM | POA: Insufficient documentation

## 2022-09-12 DIAGNOSIS — Z794 Long term (current) use of insulin: Secondary | ICD-10-CM | POA: Diagnosis not present

## 2022-09-12 DIAGNOSIS — J029 Acute pharyngitis, unspecified: Secondary | ICD-10-CM | POA: Diagnosis present

## 2022-09-12 DIAGNOSIS — J020 Streptococcal pharyngitis: Secondary | ICD-10-CM | POA: Insufficient documentation

## 2022-09-12 LAB — RESP PANEL BY RT-PCR (FLU A&B, COVID) ARPGX2
Influenza A by PCR: NEGATIVE
Influenza B by PCR: NEGATIVE
SARS Coronavirus 2 by RT PCR: NEGATIVE

## 2022-09-12 LAB — GROUP A STREP BY PCR: Group A Strep by PCR: DETECTED — AB

## 2022-09-12 MED ORDER — LIDOCAINE VISCOUS HCL 2 % MT SOLN: 15.0000 mL | OROMUCOSAL | 0 refills | Status: DC | PRN

## 2022-09-12 MED ORDER — PENICILLIN G BENZATHINE 1200000 UNIT/2ML IM SUSY
1.2000 10*6.[IU] | PREFILLED_SYRINGE | Freq: Once | INTRAMUSCULAR | Status: AC
  Administered 2022-09-12: 1.2 10*6.[IU] via INTRAMUSCULAR
  Filled 2022-09-12: qty 2

## 2022-09-12 NOTE — ED Provider Notes (Signed)
[facility] EMERGENCY DEPARTMENT Provider Note   CSN: [id number] Arrival date & time: 09/12/22  0342     History  Chief Complaint  Patient presents with   Sore Throat    Jeffery Santos is a 41 y.o. male.  The history is provided by the patient and medical records.  Sore Throat   41 y.o. M here with sore throat.  Began yesterday around 1PM.  Does have pain with swallowing.  Hx of strep with similar symptoms.  No sick contacts. No fever.  No meds taken PTA.  Home Medications Prior to Admission medications   Medication Sig Start Date End Date Taking? Authorizing Provider  lidocaine (XYLOCAINE) 2 % solution Use as directed 15 mLs in the mouth or throat as needed for mouth pain. 09/12/22  Yes Larene Pickett, PA-C  blood glucose meter kit and supplies KIT Use up to four times daily as directed. (FOR ICD-9 250.00, 250.01). 07/08/22   Thurnell Lose, MD  buPROPion (WELLBUTRIN) 75 MG tablet Take 75 mg by mouth 2 (two) times daily. 03/29/22   [provider]  docusate sodium (COLACE) 100 MG capsule Take 2 capsules (200 mg total) by mouth 2 (two) times daily as needed for mild constipation. 07/08/22   Thurnell Lose, MD  Fingerstix Lancets MISC Use to check blood sugars up to 4 times daily 07/08/22   Thurnell Lose, MD  glucose blood test strip use as directed to check blood sugars up to 4 times daily 07/08/22   Thurnell Lose, MD  hydrocortisone (ANUSOL-HC) 2.5 % rectal cream Place rectally 4 (four) times daily. 07/08/22   Thurnell Lose, MD  insulin glargine-yfgn (SEMGLEE) 100 UNIT/ML Pen Inject 40 Units into the skin daily. Patient not taking: Reported on 09/08/2022 07/08/22   Thurnell Lose, MD  insulin lispro (HUMALOG) 100 UNIT/ML injection Before each meal 3 times a day, 140-199 - 2 units, 200-250 - 4 units, 251-299 - 6 units,  300-349 - 8 units,  350 or above 10 units. Patient not taking: Reported on 09/08/2022 07/08/22   Thurnell Lose, MD  Insulin  Syringe-Needle U-100 30G X 1/2" 0.3 ML MISC Use to inject insulin as needed 07/08/22   Thurnell Lose, MD  ketoconazole (NIZORAL) 2 % cream Apply 1 Application topically daily. 06/10/22   Vanessa Kick, MD  metFORMIN (GLUCOPHAGE) 500 MG tablet Take 1 tablet (500 mg total) by mouth 2 (two) times daily with a meal. 07/08/22   Thurnell Lose, MD  prazosin (MINIPRESS) 2 MG capsule Take 2 mg by mouth at bedtime.    [provider]  rosuvastatin (CRESTOR) 10 MG tablet Take 1 tablet (10 mg total) by mouth daily. 07/08/22   Thurnell Lose, MD  traZODone (DESYREL) 100 MG tablet Take 50-100 mg by mouth daily as needed for sleep.    [provider]  ARIPiprazole (ABILIFY) 10 MG tablet Take by mouth.  11/17/20  [provider]  calcium carbonate (OS-CAL) 1250 (500 Ca) MG chewable tablet Chew by mouth. 05/21/17 01/28/20  [provider]  cloNIDine (CATAPRES) 0.1 MG tablet Take by mouth.  01/28/20  [provider]  FLUoxetine (PROZAC) 20 MG capsule Take by mouth.  01/28/20  [provider]  gabapentin (NEURONTIN) 100 MG capsule Take by mouth. 05/21/17 12/12/19  [provider]  promethazine (PHENERGAN) 12.5 MG tablet Take by mouth. 05/21/17 12/12/19  [provider]      Allergies  Hydrocodone-acetaminophen    Review of Systems   Review of Systems  HENT:  Positive for sore throat.   All other systems reviewed and are negative.   Physical Exam Updated Vital Signs BP (!) 121/101 (BP Location: Right Arm)   Pulse 77   Temp 98.4 F (36.9 C) (Oral)   Resp 18   Ht _0  (1.803 m)   Wt 117.9 kg   SpO2 94%   BMI 36.26 kg/m   Physical Exam Vitals and nursing note reviewed.  Constitutional:      Appearance: He is well-developed.  HENT:     Head: Normocephalic and atraumatic.     Mouth/Throat:     Comments: Erythema of posterior oropharynx without edema or exudates; uvula midline without evidence of peritonsillar abscess; handling  secretions appropriately; no difficulty swallowing or speaking; normal phonation without stridor Eyes:     Conjunctiva/sclera: Conjunctivae normal.     Pupils: Pupils are equal, round, and reactive to light.  Cardiovascular:     Rate and Rhythm: Normal rate and regular rhythm.     Heart sounds: Normal heart sounds.  Pulmonary:     Effort: Pulmonary effort is normal.     Breath sounds: Normal breath sounds.  Abdominal:     General: Bowel sounds are normal.     Palpations: Abdomen is soft.  Musculoskeletal:        General: Normal range of motion.     Cervical back: Normal range of motion.  Skin:    General: Skin is warm and dry.  Neurological:     Mental Status: He is alert and oriented to person, place, and time.     ED Results / Procedures / Treatments   Labs (all labs ordered are listed, but only abnormal results are displayed) Labs Reviewed  GROUP A STREP BY PCR - Abnormal; Notable for the following components:      Result Value   Group A Strep by PCR DETECTED (*)    All other components within normal limits  RESP PANEL BY RT-PCR (FLU A&B, COVID) ARPGX2    EKG None  Radiology No results found.  Procedures Procedures    Medications Ordered in ED Medications  penicillin g benzathine (BICILLIN LA) 1200000 UNIT/2ML injection 1.2 Million Units (1.2 Million Units Intramuscular Given 09/12/22 0534)    ED Course/ Medical Decision Making/ A&P                           Medical Decision Making Risk Prescription drug management.   41 y.o. M here with sore throat.  Afebrile, non-toxic.  Erythema of posterior oropharynx without edema or exudates.  Handling secretions well, no stridor.  No signs/symptoms concerning for PTA or deep space infection of the neck.  Strep test is positive.  Opted for treatment with bicillin.  Plan to d/c home with symptomatic care.  Can return here for new concerns.  Final Clinical Impression(s) / ED Diagnoses Final diagnoses:  Strep  pharyngitis    Rx / DC Orders ED Discharge Orders          Ordered    lidocaine (XYLOCAINE) 2 % solution  As needed        09/12/22 0525              Larene Pickett, PA-C 35/59/74 1638    Delora Fuel, MD 45/36/46 [phone]

## 2022-09-12 NOTE — Discharge Instructions (Signed)
Take the prescribed medication as directed. °Follow-up with your primary care doctor. °Return to the ED for new or worsening symptoms. °

## 2022-09-12 NOTE — ED Provider Triage Note (Signed)
Emergency Medicine Provider Triage Evaluation Note  Jeffery Santos , a 41 y.o. male  was evaluated in triage.  Pt complains of sore throat that started yesterday.  Denies sick contacts with similar.  Reports hx of strep in the past with similar symptoms.  Review of Systems  Positive: Sore throat Negative: fever  Physical Exam  BP (!) 152/100 (BP Location: Right Arm)   Pulse 86   Temp 97.9 F (36.6 C) (Oral)   Resp 18   Ht 5\' 11"  (1.803 m)   Wt 117.9 kg   SpO2 96%   BMI 36.26 kg/m  Gen:   Awake, no distress   Resp:  Normal effort  MSK:   Moves extremities without difficulty  Other:  Erythema posterior oropharynx without edema or exudates, handling secretions well, normal phonation, no stridor Medical Decision Making  Medically screening exam initiated at 3:52 AM.  Appropriate orders placed.  DELMORE SEAR was informed that the remainder of the evaluation will be completed by another provider, this initial triage assessment does not replace that evaluation, and the importance of remaining in the ED until their evaluation is complete.  Sore throat.  Rapid strep, covid/flu screen sent.   [name], PA-C 09/12/22 [phone]

## 2022-09-12 NOTE — ED Triage Notes (Signed)
Pt arrived POV from home c/o a sore throat that started yesterday around 1pm. Pt states the pain is a 10/10.

## 2022-09-15 ENCOUNTER — Encounter: Payer: PRIVATE HEALTH INSURANCE | Admitting: Nutrition

## 2022-10-06 ENCOUNTER — Ambulatory Visit: Payer: PRIVATE HEALTH INSURANCE | Attending: Internal Medicine | Admitting: Internal Medicine

## 2022-10-06 ENCOUNTER — Encounter: Payer: Self-pay | Admitting: Internal Medicine

## 2022-10-06 VITALS — BP 128/90 | HR 68 | Temp 98.5°F | Ht 71.0 in | Wt 272.0 lb

## 2022-10-06 DIAGNOSIS — F320 Major depressive disorder, single episode, mild: Secondary | ICD-10-CM

## 2022-10-06 DIAGNOSIS — E1169 Type 2 diabetes mellitus with other specified complication: Secondary | ICD-10-CM | POA: Diagnosis not present

## 2022-10-06 DIAGNOSIS — Z7689 Persons encountering health services in other specified circumstances: Secondary | ICD-10-CM

## 2022-10-06 DIAGNOSIS — R030 Elevated blood-pressure reading, without diagnosis of hypertension: Secondary | ICD-10-CM

## 2022-10-06 DIAGNOSIS — E669 Obesity, unspecified: Secondary | ICD-10-CM

## 2022-10-06 DIAGNOSIS — B353 Tinea pedis: Secondary | ICD-10-CM

## 2022-10-06 DIAGNOSIS — F4310 Post-traumatic stress disorder, unspecified: Secondary | ICD-10-CM

## 2022-10-06 DIAGNOSIS — L309 Dermatitis, unspecified: Secondary | ICD-10-CM

## 2022-10-06 DIAGNOSIS — Z2821 Immunization not carried out because of patient refusal: Secondary | ICD-10-CM

## 2022-10-06 LAB — GLUCOSE, POCT (MANUAL RESULT ENTRY): POC Glucose: 134 mg/dl — AB (ref 70–99)

## 2022-10-06 LAB — POCT GLYCOSYLATED HEMOGLOBIN (HGB A1C): HbA1c, POC (controlled diabetic range): 6.9 % (ref 0.0–7.0)

## 2022-10-06 MED ORDER — METFORMIN HCL 500 MG PO TABS: 500.0000 mg | ORAL_TABLET | Freq: Two times a day (BID) | ORAL | 6 refills | Status: DC

## 2022-10-06 MED ORDER — TRIAMCINOLONE ACETONIDE 0.1 % EX CREA: 1.0000 | TOPICAL_CREAM | Freq: Two times a day (BID) | CUTANEOUS | 0 refills | Status: DC

## 2022-10-06 MED ORDER — ROSUVASTATIN CALCIUM 10 MG PO TABS: 10.0000 mg | ORAL_TABLET | Freq: Every day | ORAL | 3 refills | Status: DC

## 2022-10-06 NOTE — Patient Instructions (Signed)
Purchase some Lamisil cream over-the-counter and use it between the toes for the fungal infection.  I have sent a prescription to your pharmacy for a cream called triamcinolone cream to use on the rash on the right lower leg.  I have also referred you to the dermatologist.  Stop insulin.  Continue metformin.  Diabetes Mellitus and Standards of Medical Care Living with and managing diabetes (diabetes mellitus) can be complicated. Your diabetes treatment may be managed by a team of health care providers, including: A physician who specializes in diabetes (endocrinologist). You might also have visits with a nurse practitioner or physician assistant. Nurses. A registered dietitian. A certified diabetes care and education specialist. An exercise specialist. A pharmacist. An eye doctor. A foot specialist (podiatrist). A dental care provider. A primary care provider. A mental health care provider. How to manage your diabetes You can do many things to successfully manage your diabetes. Your health care providers will follow guidelines to help you get the best quality of care. Here are general guidelines for your diabetes management plan. Your health care providers may give you more specific instructions. Physical exams When you are diagnosed with diabetes, and each year after that, your health care provider will ask about your medical and family history. You will have a physical exam, which may include: Measuring your height, weight, and body mass index (BMI). Checking your blood pressure. This will be done at every routine medical visit. Your target blood pressure may vary depending on your medical conditions, your age, and other factors. A thyroid exam. A skin exam. Screening for nerve damage (peripheral neuropathy). This may include checking the pulse in your legs and feet and the level of sensation in your hands and feet. A foot exam to inspect the structure and skin of your feet, including  checking for cuts, bruises, redness, blisters, sores, or other problems. Screening for blood vessel (vascular) problems. This may include checking the pulse in your legs and feet and checking your temperature. Blood tests Depending on your treatment plan and your personal needs, you may have the following tests: Hemoglobin A1C (HbA1C). This test provides information about blood sugar (glucose) control over the previous 2-3 months. It is used to adjust your treatment plan, if needed. This test will be done: At least 2 times a year, if you are meeting your treatment goals. 4 times a year, if you are not meeting your treatment goals or if your goals have changed. Lipid testing, including total cholesterol, LDL and HDL cholesterol, and triglyceride levels. The goal for LDL is less than 100 mg/dL (5.5 mmol/L). If you are at high risk for complications, the goal is less than 70 mg/dL (3.9 mmol/L). The goal for HDL is 40 mg/dL (2.2 mmol/L) or higher for men, and 50 mg/dL (2.8 mmol/L) or higher for women. An HDL cholesterol of 60 mg/dL (3.3 mmol/L) or higher gives some protection against heart disease. The goal for triglycerides is less than 150 mg/dL (8.3 mmol/L). Liver function tests. Kidney function tests. Thyroid function tests.  Dental and eye exams  Visit your dentist two times a year. If you have type 1 diabetes, your health care provider may recommend an eye exam within 5 years after you are diagnosed, and then once a year after your first exam. For children with type 1 diabetes, the health care provider may recommend an eye exam when your child is age 68 or older and has had diabetes for 3-5 years. After the first exam, your child  should get an eye exam once a year. If you have type 2 diabetes, your health care provider may recommend an eye exam as soon as you are diagnosed, and then every 1-2 years after your first exam. Immunizations A yearly flu (influenza) vaccine is recommended annually  for everyone 6 months or older. This is especially important if you have diabetes. The pneumonia (pneumococcal) vaccine is recommended for everyone 2 years or older who has diabetes. If you are age 25 or older, you may get the pneumonia vaccine as a series of two separate shots. The hepatitis B vaccine is recommended for adults shortly after being diagnosed with diabetes. Adults and children with diabetes should receive all other vaccines according to age-specific recommendations from the Centers for Disease Control and Prevention (CDC). Mental and emotional health Screening for symptoms of eating disorders, anxiety, and depression is recommended at the time of diagnosis and after as needed. If your screening shows that you have symptoms, you may need more evaluation. You may work with a mental health care provider. Follow these instructions at home: Treatment plan You will monitor your blood glucose levels and may give yourself insulin. Your treatment plan will be reviewed at every medical visit. You and your health care provider will discuss: How you are taking your medicines, including insulin. Any side effects you have. Your blood glucose level target goals. How often you monitor your blood glucose level. Lifestyle habits, such as activity level and tobacco, alcohol, and substance use. Education Your health care provider will assess how well you are monitoring your blood glucose levels and whether you are taking your insulin and medicines correctly. He or she may refer you to: A certified diabetes care and education specialist to manage your diabetes throughout your life, starting at diagnosis. A registered dietitian who can create and review your personal nutrition plan. An exercise specialist who can discuss your activity level and exercise plan. General instructions Take over-the-counter and prescription medicines only as told by your health care provider. Keep all follow-up visits. This  is important. Where to find support There are many diabetes support networks, including: American Diabetes Association (ADA): diabetes.org Defeat Diabetes Foundation: defeatdiabetes.org Where to find more information American Diabetes Association (ADA): www.diabetes.org Association of Diabetes Care & Education Specialists (ADCES): diabeteseducator.org International Diabetes Federation (IDF): http://hill.biz/ Summary Managing diabetes (diabetes mellitus) can be complicated. Your diabetes treatment may be managed by a team of health care providers. Your health care providers follow guidelines to help you get the best quality care. You should have physical exams, blood tests, blood pressure monitoring, immunizations, and screening tests regularly. Stay updated on how to manage your diabetes. Your health care providers may also give you more specific instructions based on your individual health. This information is not intended to replace advice given to you by your health care provider. Make sure you discuss any questions you have with your health care provider. Document Revised: 04/26/2020 Document Reviewed: 04/26/2020 Elsevier Patient Education  2023 [employer].
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
## 2022-10-07 LAB — MICROALBUMIN / CREATININE URINE RATIO
Creatinine, Urine: 150.2 mg/dL
Microalb/Creat Ratio: 5 mg/g creat (ref 0–29)
Microalbumin, Urine: 7 ug/mL

## 2022-10-08 ENCOUNTER — Other Ambulatory Visit: Payer: Self-pay | Admitting: Internal Medicine

## 2022-10-08 DIAGNOSIS — R7989 Other specified abnormal findings of blood chemistry: Secondary | ICD-10-CM

## 2022-10-08 LAB — HEPATIC FUNCTION PANEL
ALT: 58 IU/L — ABNORMAL HIGH (ref 0–44)
AST: 34 IU/L (ref 0–40)
Albumin: 4.3 g/dL (ref 4.1–5.1)
Alkaline Phosphatase: 68 IU/L (ref 44–121)
Bilirubin Total: 0.3 mg/dL (ref 0.0–1.2)
Bilirubin, Direct: <0.1 mg/dL (ref 0.00–0.40)
Total Protein: 7.4 g/dL (ref 6.0–8.5)

## 2022-10-23 ENCOUNTER — Ambulatory Visit: Payer: PRIVATE HEALTH INSURANCE | Admitting: Dietician

## 2022-11-12 ENCOUNTER — Ambulatory Visit: Payer: PRIVATE HEALTH INSURANCE | Attending: Internal Medicine

## 2022-11-12 DIAGNOSIS — R7989 Other specified abnormal findings of blood chemistry: Secondary | ICD-10-CM

## 2022-11-12 NOTE — Progress Notes (Deleted)
   S:     PCP: Dr. Wynetta Emery  42 y.o. male who presents for hypertension evaluation, education, and management. PMH is significant for PTSD, T2DM, MDD.   Patient was referred and last seen by Primary Care Provider, Dr. Wynetta Emery, on 10/06/2022. Patient previously seen at [facility] this year but presented to establish care at last visit.  At last visit, BP was elevated at last visit without a diagnosis of hypertension. He reported normal BP readings at home, stating it was likely elevated because he was nervous. He was instructed to follow back up with the CPP for a BP check.   Today, patient arrives in *** spirits and presents without *** assistance. *** Denies dizziness, headache, blurred vision, swelling.   Patient reports hypertension was diagnosed in ***.   Family/Social history:  -Fhx:  -Tobacco: former (quit 2022) -Alcohol: endorses  Medication adherence *** . Patient has *** taken BP medications today.   Current antihypertensives include: prazosin 2mg  daily at bedtime (***)  Reported home BP readings: ***  Patient reported dietary habits: Eats *** meals/day Breakfast: *** Lunch: *** Dinner: *** Snacks: *** Drinks: ***  Patient-reported exercise habits: ***  ASCVD risk factors include: ***  O:    Last 3 Office BP readings: BP Readings from Last 3 Encounters:  10/06/22 (!) 128/90  09/12/22 (!) 121/101  07/08/22 119/63    BMET    Component Value Date/Time   NA 136 07/07/2022 0232   K 3.7 07/07/2022 0232   CL 106 07/07/2022 0232   CO2 25 07/07/2022 0232   GLUCOSE 259 (H) 07/07/2022 0232   BUN 7 07/07/2022 0232   CREATININE 1.09 07/07/2022 0232   CALCIUM 8.0 (L) 07/07/2022 0232   GFRNONAA >60 07/07/2022 0232    Renal function: CrCl cannot be calculated (Patient's most recent lab result is older than the maximum 21 days allowed.).  Clinical ASCVD: {YES/NO:21197} The 10-year ASCVD risk score (Arnett DK, et al., 2019) is: 7.6%   Values used to calculate the  score:     Age: 52 years     Sex: Male     Is Non-Hispanic African American: Yes     Diabetic: Yes     Tobacco smoker: No     Systolic Blood Pressure: 097 mmHg     Is BP treated: No     HDL Cholesterol: 25 mg/dL     Total Cholesterol: 164 mg/dL    A/P: Hypertension diagnosed *** currently *** on current medications. BP goal < 130/80 *** mmHg. Medication adherence appears ***. Control is suboptimal due to ***.  -{Meds adjust:18428} ***.  -Patient educated on purpose, proper use, and potential adverse effects of ***.  -F/u labs ordered - *** -Counseled on lifestyle modifications for blood pressure control including reduced dietary sodium, increased exercise, adequate sleep. -Encouraged patient to check BP at home and bring log of readings to next visit. Counseled on proper use of home BP cuff.    Results reviewed and written information provided.    Written patient instructions provided. Patient verbalized understanding of treatment plan.  Total time in face to face counseling *** minutes.    Follow-up:  Pharmacist ***. PCP clinic visit in April 2024  Maryan Puls, PharmD PGY-1 [facility] Resident

## 2022-11-13 ENCOUNTER — Ambulatory Visit: Payer: PRIVATE HEALTH INSURANCE | Admitting: Pharmacist

## 2022-11-13 LAB — HEPATITIS C ANTIBODY: Hep C Virus Ab: NONREACTIVE

## 2022-11-13 LAB — HEPATITIS B SURFACE ANTIGEN: Hepatitis B Surface Ag: NEGATIVE

## 2022-11-13 LAB — HEPATITIS B SURFACE ANTIBODY, QUANTITATIVE: Hepatitis B Surf Ab Quant: <3.1 m[IU]/mL — ABNORMAL LOW (ref >9.9)

## 2022-11-13 LAB — HEPATITIS B CORE ANTIBODY, TOTAL: Hep B Core Total Ab: NEGATIVE

## 2023-01-08 ENCOUNTER — Ambulatory Visit (HOSPITAL_COMMUNITY)
Admission: RE | Admit: 2023-01-08 | Discharge: 2023-01-08 | Disposition: A | Discharge status: Home / Self Care | Payer: PRIVATE HEALTH INSURANCE | Source: Ambulatory Visit | Attending: Internal Medicine | Admitting: Internal Medicine

## 2023-01-23 ENCOUNTER — Encounter (HOSPITAL_COMMUNITY): Payer: Self-pay

## 2023-01-23 ENCOUNTER — Ambulatory Visit (HOSPITAL_COMMUNITY)
Admission: EM | Admit: 2023-01-23 | Discharge: 2023-01-23 | Disposition: A | Discharge status: Home / Self Care | Payer: Medicaid Other | Attending: Emergency Medicine | Admitting: Emergency Medicine

## 2023-01-23 ENCOUNTER — Ambulatory Visit (INDEPENDENT_AMBULATORY_CARE_PROVIDER_SITE_OTHER): Payer: Medicaid Other

## 2023-01-23 DIAGNOSIS — M5137 Other intervertebral disc degeneration, lumbosacral region: Secondary | ICD-10-CM | POA: Diagnosis not present

## 2023-01-23 DIAGNOSIS — M47816 Spondylosis without myelopathy or radiculopathy, lumbar region: Secondary | ICD-10-CM | POA: Diagnosis not present

## 2023-01-23 DIAGNOSIS — M5432 Sciatica, left side: Secondary | ICD-10-CM | POA: Diagnosis not present

## 2023-01-23 DIAGNOSIS — M1612 Unilateral primary osteoarthritis, left hip: Secondary | ICD-10-CM

## 2023-01-23 DIAGNOSIS — M545 Low back pain, unspecified: Secondary | ICD-10-CM

## 2023-01-23 DIAGNOSIS — M25552 Pain in left hip: Secondary | ICD-10-CM

## 2023-01-23 DIAGNOSIS — R102 Pelvic and perineal pain: Secondary | ICD-10-CM | POA: Diagnosis not present

## 2023-01-23 MED ORDER — METHYLPREDNISOLONE 4 MG PO TABS: ORAL_TABLET | ORAL | 0 refills | Status: AC

## 2023-01-23 MED ORDER — BACLOFEN 10 MG PO TABS: 10.0000 mg | ORAL_TABLET | Freq: Three times a day (TID) | ORAL | 0 refills | Status: AC

## 2023-01-23 MED ORDER — KETOROLAC TROMETHAMINE 60 MG/2ML IM SOLN
60.0000 mg | Freq: Once | INTRAMUSCULAR | Status: AC
  Administered 2023-01-23: 60 mg via INTRAMUSCULAR

## 2023-01-23 MED ORDER — KETOROLAC TROMETHAMINE 60 MG/2ML IM SOLN
INTRAMUSCULAR | Status: AC
  Filled 2023-01-23: qty 2

## 2023-01-23 NOTE — ED Triage Notes (Signed)
Pt presents with left-sided hip pain x 2 weeks. Pt denies any recent falls or trauma. Pt has not taken any  medications to help with pain.
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
## 2023-01-23 NOTE — Discharge Instructions (Addendum)
The x-ray of your lower back showed some mild loss of disc space which is related to degenerative arthritis.  As we discussed, by the time we reach 42 years of age, we have a little bit of arthritis somewhere in our body.  The x-ray of your left hip reveals  The mainstay of therapy for musculoskeletal pain is reduction of inflammation and relaxation of tension which is causing inflammation.  Keep in mind, pain always begets more pain.  To help you stay ahead of your pain and inflammation, I have provided the following regimen for you:   During your visit today, you received an injection of ketorolac, high-dose nonsteroidal anti-inflammatory pain medication that should significantly reduce your pain for the next 6 to 8 hours.   This evening, please begin taking methylprednisolone twice daily as prescribed.  Please take all tablets of the recommended dose with your breakfast and evening meals.  This will continue to keep your inflammation under control until your body can heal.  You may find that your blood sugars become elevated while you are taking methylprednisolone, which is a steroid.  You can offset this by taking 10 units of Lantus daily while you are taking methylprednisolone.  Please also note that when she did complete the steroids your sugars will return to baseline.  This evening, you can begin taking baclofen 10 mg.  This is a highly effective muscle relaxer and antispasmodic which should continue to provide you with relaxation of your tense muscles, allow you to sleep well and to keep your pain under control.  You can continue taking this medication 3 times daily as you need to.  If you find that this medication makes you too sleepy, you can break them in half for your daytime doses and, if needed double them for your nighttime dose.  Do not take more than 30 mg of baclofen in a 24-hour period.  During the day, please set aside time to apply ice to the affected area 4 times daily for 20  minutes each application.  This can be achieved by using a bag of frozen peas or corn, a Ziploc bag filled with ice and water, or Ziploc bag filled with half rubbing alcohol and half Dawn dish detergent, frozen into a slush.  Please be careful not to apply ice directly to your skin, always place a soft cloth between you and the ice pack.  Over-the-counter products such as IcyHot and Biofreeze do not work nearly as well.   Please consider discussing referral to physical therapy with your primary care provider.  Physical therapist are very good at teasing out the underlying cause of acute lower back pain and helping with prevention of future recurrences.   Please avoid attempts to stretch or strengthen the affected area until you are feeling completely pain-free.  Attempts to do so will only prolong the healing process.   I also recommend that you remain out of work for the next several days, I provided you with a note to return to work in 3 days.  If you feel that you need this time extended, please follow-up with your primary care provider or return to urgent care for reevaluation so that we can provide you with a note for another 3 days.   Thank you for visiting urgent care today.  We appreciate the opportunity to participate in your care.

## 2023-02-05 ENCOUNTER — Ambulatory Visit: Payer: Self-pay | Admitting: Internal Medicine

## 2023-02-25 ENCOUNTER — Ambulatory Visit: Payer: Medicaid Other | Admitting: Physician Assistant

## 2023-02-25 LAB — GLUCOSE, POCT (MANUAL RESULT ENTRY): Glucose Fasting, POC: 310 mg/dL — AB (ref 70–99)

## 2023-02-25 NOTE — Progress Notes (Signed)
Pt does not have any SDOH needs at the time.

## 2023-02-26 ENCOUNTER — Encounter: Payer: Self-pay | Admitting: Internal Medicine

## 2023-02-26 ENCOUNTER — Ambulatory Visit: Payer: Medicaid Other | Attending: Internal Medicine | Admitting: Internal Medicine

## 2023-02-26 ENCOUNTER — Other Ambulatory Visit: Payer: Self-pay | Admitting: Internal Medicine

## 2023-02-26 VITALS — BP 122/71 | HR 96 | Temp 98.4°F | Ht 71.0 in | Wt 258.0 lb

## 2023-02-26 DIAGNOSIS — Z79899 Other long term (current) drug therapy: Secondary | ICD-10-CM | POA: Diagnosis not present

## 2023-02-26 DIAGNOSIS — E1165 Type 2 diabetes mellitus with hyperglycemia: Secondary | ICD-10-CM

## 2023-02-26 DIAGNOSIS — Z87891 Personal history of nicotine dependence: Secondary | ICD-10-CM | POA: Diagnosis not present

## 2023-02-26 DIAGNOSIS — F329 Major depressive disorder, single episode, unspecified: Secondary | ICD-10-CM | POA: Diagnosis not present

## 2023-02-26 DIAGNOSIS — F4310 Post-traumatic stress disorder, unspecified: Secondary | ICD-10-CM | POA: Insufficient documentation

## 2023-02-26 DIAGNOSIS — Z794 Long term (current) use of insulin: Secondary | ICD-10-CM | POA: Insufficient documentation

## 2023-02-26 LAB — POCT URINALYSIS DIP (CLINITEK)
Bilirubin, UA: NEGATIVE
Blood, UA: NEGATIVE
Glucose, UA: 500 mg/dL — AB
Ketones, POC UA: NEGATIVE mg/dL
Leukocytes, UA: NEGATIVE
Nitrite, UA: NEGATIVE
POC PROTEIN,UA: NEGATIVE
Spec Grav, UA: 1.01 (ref 1.010–1.025)
Urobilinogen, UA: 0.2 E.U./dL
pH, UA: 5.5 (ref 5.0–8.0)

## 2023-02-26 LAB — POCT GLYCOSYLATED HEMOGLOBIN (HGB A1C): HbA1c, POC (controlled diabetic range): 13.1 % — AB (ref 0.0–7.0)

## 2023-02-26 LAB — GLUCOSE, POCT (MANUAL RESULT ENTRY)
POC Glucose: 484 mg/dl — AB (ref 70–99)
POC Glucose: 366 mg/dl — AB (ref 70–99)

## 2023-02-26 MED ORDER — FREESTYLE LIBRE 3 SENSOR MISC: 6 refills | Status: DC

## 2023-02-26 MED ORDER — METFORMIN HCL 500 MG PO TABS: 500.0000 mg | ORAL_TABLET | Freq: Two times a day (BID) | ORAL | 6 refills | Status: DC

## 2023-02-26 MED ORDER — ACCU-CHEK GUIDE W/DEVICE KIT: PACK | 0 refills | Status: DC

## 2023-02-26 MED ORDER — LANTUS SOLOSTAR 100 UNIT/ML ~~LOC~~ SOPN: 15.0000 [IU] | PEN_INJECTOR | Freq: Every day | SUBCUTANEOUS | 1 refills | Status: DC

## 2023-02-26 MED ORDER — INSULIN GLARGINE-YFGN 100 UNIT/ML ~~LOC~~ SOPN: 15.0000 [IU] | PEN_INJECTOR | Freq: Every day | SUBCUTANEOUS | 2 refills | Status: DC

## 2023-02-26 MED ORDER — ACCU-CHEK SOFTCLIX LANCETS MISC: 12 refills | Status: DC

## 2023-02-26 MED ORDER — ROSUVASTATIN CALCIUM 10 MG PO TABS: 10.0000 mg | ORAL_TABLET | Freq: Every day | ORAL | 3 refills | Status: DC

## 2023-02-26 MED ORDER — INSULIN ASPART 100 UNIT/ML IJ SOLN
8.0000 [IU] | Freq: Once | INTRAMUSCULAR | Status: AC
  Administered 2023-02-26: 8 [IU] via SUBCUTANEOUS

## 2023-02-26 MED ORDER — NOVOLOG FLEXPEN 100 UNIT/ML ~~LOC~~ SOPN: 4.0000 [IU] | PEN_INJECTOR | Freq: Three times a day (TID) | SUBCUTANEOUS | 3 refills | Status: DC

## 2023-02-26 MED ORDER — ACCU-CHEK GUIDE VI STRP: ORAL_STRIP | 12 refills | Status: DC

## 2023-02-26 MED ORDER — FREESTYLE LIBRE 3 READER DEVI: 1.0000 | Freq: Every day | 0 refills | Status: DC

## 2023-02-26 MED ORDER — PEN NEEDLES 31G X 8 MM MISC
6 refills | Status: DC
Start: 2023-02-26 — End: 2023-06-01

## 2023-02-26 MED ORDER — INSULIN LISPRO (1 UNIT DIAL) 100 UNIT/ML (KWIKPEN): 4.0000 [IU] | PEN_INJECTOR | Freq: Three times a day (TID) | SUBCUTANEOUS | 0 refills | Status: DC

## 2023-02-26 NOTE — Progress Notes (Signed)
Patient did not return to [facility] to be seen
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
## 2023-02-26 NOTE — Patient Instructions (Signed)
Start Semglee insulin 15 units at bedtime. Take NovoLog insulin 4 units 3 times a day with meals. Refill has been sent on metformin 500 mg to take twice a day. I have also sent refill on cholesterol medicine called rosuvastatin.  Prescription has been sent to the pharmacy for continuous glucose monitor, call freestyle libre 3.  If it is covered by your insurance, please have the clinical pharmacist show you how to set it up.  You will need to download the app from the App Store onto your phone.  Hypoglycemia Hypoglycemia is when the sugar (glucose) level in your blood is too low. Low blood sugar can happen to people who have diabetes and people who do not have diabetes. Low blood sugar can happen quickly, and it can be an emergency. What are the causes? This condition happens most often in people who have diabetes. It may be caused by: Diabetes medicine. Not eating enough, or not eating often enough. Doing more physical activity. Drinking alcohol on an empty stomach. If you do not have diabetes, this condition may be caused by: A tumor in the pancreas. Not eating enough, or not eating for long periods at a time (fasting). A very bad infection or illness. Problems after having weight loss (bariatric) surgery. Kidney failure or liver failure. Certain medicines. What increases the risk? This condition is more likely to develop in people who: Have diabetes and take medicines to lower their blood sugar. Abuse alcohol. Have a very bad illness. What are the signs or symptoms? Mild Hunger. Sweating and feeling clammy. Feeling dizzy or light-headed. Being sleepy or having trouble sleeping. Feeling like you may vomit (nauseous). A fast heartbeat. A headache. Blurry vision. Mood changes, such as: Being grouchy. Feeling worried or nervous (anxious). Tingling or loss of feeling (numbness) around your mouth, lips, or tongue. Moderate Confusion and poor judgment. Behavior  changes. Weakness. Uneven heartbeat. Trouble with moving (coordination). Very low Very low blood sugar (severe hypoglycemia) is a medical emergency. It can cause: Fainting. Seizures. Loss of consciousness (coma). Death. How is this treated? Treating low blood sugar Low blood sugar is often treated by eating or drinking something that has sugar in it right away. The food or drink should contain 15 grams of a fast-acting carb (carbohydrate). Options include: 4 oz (120 mL) of fruit juice. 4 oz (120 mL) of regular soda (not diet soda). A few pieces of hard candy. Check food labels to see how many pieces to eat for 15 grams. 1 Tbsp (15 mL) of sugar or honey. 4 glucose tablets. 1 tube of glucose gel. Treating low blood sugar if you have diabetes If you can think clearly and swallow safely, follow the 15:15 rule: Take 15 grams of a fast-acting carb. Talk with your doctor about how much you should take. Always keep a source of fast-acting carb with you, such as: Glucose tablets (take 4 tablets). A few pieces of hard candy. Check food labels to see how many pieces to eat for 15 grams. 4 oz (120 mL) of fruit juice. 4 oz (120 mL) of regular soda (not diet soda). 1 Tbsp (15 mL) of honey or sugar. 1 tube of glucose gel. Check your blood sugar 15 minutes after you take the carb. If your blood sugar is still at or below 70 mg/dL (3.9 mmol/L), take 15 grams of a carb again. If your blood sugar does not go above 70 mg/dL (3.9 mmol/L) after 3 tries, get help right away. After your blood sugar  goes back to normal, eat a meal or a snack within 1 hour.  Treating very low blood sugar If your blood sugar is below 54 mg/dL (3 mmol/L), you have very low blood sugar, or severe hypoglycemia. This is an emergency. Get medical help right away. If you have very low blood sugar and you cannot eat or drink, you will need to be given a hormone called glucagon. A family member or friend should learn how to check  your blood sugar and how to give you glucagon. Ask your doctor if you need to have an emergency glucagon kit at home. Very low blood sugar may also need to be treated in a hospital. Follow these instructions at home: General instructions Take over-the-counter and prescription medicines only as told by your doctor. Stay aware of your blood sugar as told by your doctor. If you drink alcohol: Limit how much you have to: 0-1 drink a day for women who are not pregnant. 0-2 drinks a day for men. Know how much alcohol is in your drink. In the [place] one drink equals one 12 oz bottle of beer (355 mL), one 5 oz glass of wine (148 mL), or one 1 oz glass of hard liquor (44 mL). Be sure to eat food when you drink alcohol. Know that your body absorbs alcohol quickly. This may lead to low blood sugar later. Be sure to keep checking your blood sugar. Keep all follow-up visits. If you have diabetes:  Always have a fast-acting carb (15 grams) with you to treat low blood sugar. Follow your diabetes care plan as told by your doctor. Make sure you: Know the symptoms of low blood sugar. Check your blood sugar as often as told. Always check it before and after exercise. Always check your blood sugar before you drive. Take your medicines as told. Follow your meal plan. Eat on time. Do not skip meals. Share your diabetes care plan with: Your work or school. People you live with. Carry a card or wear jewelry that says you have diabetes. Where to find more information American Diabetes Association: www.diabetes.org Contact a doctor if: You have trouble keeping your blood sugar in your target range. You have low blood sugar often. Get help right away if: You still have symptoms after you eat or drink something that contains 15 grams of fast-acting carb, and you cannot get your blood sugar above 70 mg/dL by following the 16:10 rule. Your blood sugar is below 54 mg/dL (3 mmol/L). You have a seizure. You  faint. These symptoms may be an emergency. Get help right away. Call your local emergency services (911 in the [place] Do not wait to see if the symptoms will go away. Do not drive yourself to the hospital. Summary Hypoglycemia happens when the level of sugar (glucose) in your blood is too low. Low blood sugar can happen to people who have diabetes and people who do not have diabetes. Low blood sugar can happen quickly, and it can be an emergency. Make sure you know the symptoms of low blood sugar and know how to treat it. Always keep a source of sugar (fast-acting carb) with you to treat low blood sugar. This information is not intended to replace advice given to you by your health care provider. Make sure you discuss any questions you have with your health care provider. Document Revised: 09/20/2020 Document Reviewed: 09/20/2020 Elsevier Patient Education  2023 [employer].

## 2023-02-27 LAB — COMPREHENSIVE METABOLIC PANEL
ALT: 52 IU/L — ABNORMAL HIGH (ref 0–44)
AST: 26 IU/L (ref 0–40)
Albumin/Globulin Ratio: 1.4 (ref 1.2–2.2)
Albumin: 4.2 g/dL (ref 4.1–5.1)
Alkaline Phosphatase: 99 IU/L (ref 44–121)
BUN/Creatinine Ratio: 12 (ref 9–20)
BUN: 13 mg/dL (ref 6–24)
Bilirubin Total: 0.3 mg/dL (ref 0.0–1.2)
CO2: 17 mmol/L — ABNORMAL LOW (ref 20–29)
Calcium: 9.2 mg/dL (ref 8.7–10.2)
Chloride: 97 mmol/L (ref 96–106)
Creatinine, Ser: 1.08 mg/dL (ref 0.76–1.27)
Globulin, Total: 3 g/dL (ref 1.5–4.5)
Glucose: 337 mg/dL — ABNORMAL HIGH (ref 70–99)
Potassium: 4.4 mmol/L (ref 3.5–5.2)
Sodium: 133 mmol/L — ABNORMAL LOW (ref 134–144)
Total Protein: 7.2 g/dL (ref 6.0–8.5)
eGFR: 87 mL/min/{1.73_m2} (ref >59)

## 2023-03-04 ENCOUNTER — Telehealth: Payer: Self-pay

## 2023-03-04 NOTE — Telephone Encounter (Signed)
Pt was called and vm was left, Information has been sent to nurse pool.   

## 2023-03-04 NOTE — Telephone Encounter (Signed)
-----   Message from Guy Franco, RN sent at 03/04/2023  2:46 PM EDT -----  ----- Message ----- From: Marcine Matar, MD Sent: 02/27/2023   1:25 PM EDT To: Johna Roles, CMA  Let patient know he has mild elevation in one of his liver function tests which we will observe for now.  Sodium level was low most likely due to the increased blood sugar from yesterday.  This should improve once he is on insulin and taking consistently.  Had slight acid buildup in the blood.  Hold off on taking the metformin for at least 1 week then after that he can restart it.

## 2023-03-10 ENCOUNTER — Other Ambulatory Visit (HOSPITAL_COMMUNITY): Payer: Self-pay

## 2023-03-20 ENCOUNTER — Encounter: Payer: Self-pay | Admitting: *Deleted

## 2023-03-20 NOTE — Progress Notes (Signed)
The patient attended 02/25/23 screening event where his BP screening results were 127/98. His At the event the patient noted no SDOH assistance needed. Per chart review the pt had an office visit with his Reva Bores, MD, on 02/26/23. The BP was wnl at that visit. Chart review also indicates a future appt 7/24 with pcp. No addidtional Health equity team support indicated at this time.   Johnnye Sima Care Guide,

## 2023-03-26 ENCOUNTER — Encounter: Payer: Self-pay | Admitting: Pharmacist

## 2023-03-26 ENCOUNTER — Ambulatory Visit: Payer: Medicaid Other | Attending: Internal Medicine | Admitting: Pharmacist

## 2023-03-26 DIAGNOSIS — E1165 Type 2 diabetes mellitus with hyperglycemia: Secondary | ICD-10-CM | POA: Insufficient documentation

## 2023-03-26 DIAGNOSIS — Z713 Dietary counseling and surveillance: Secondary | ICD-10-CM | POA: Diagnosis not present

## 2023-03-26 DIAGNOSIS — Z794 Long term (current) use of insulin: Secondary | ICD-10-CM

## 2023-03-26 MED ORDER — LANTUS SOLOSTAR 100 UNIT/ML ~~LOC~~ SOPN: 20.0000 [IU] | PEN_INJECTOR | Freq: Every day | SUBCUTANEOUS | 1 refills | Status: DC

## 2023-03-26 NOTE — Progress Notes (Signed)
S:     No chief complaint on file.  42 y.o. male who presents for diabetes evaluation, education, and management. PMH is significant for T2DM, PTSD/MDD. Patient was referred and last seen by Primary Care Provider, Dr. Laural Benes, on 02/26/2023. At that visit, A1c was up from 6.9 to 13.1%. Additionally, he was symptomatic - endorsed polyuria, polydipsia. We started him on basal/bolus insulin at the time.   Today, patient arrives in good spirits and presents without any assistance. Patient reports Diabetes is longstanding. He admits that his A1c was elevated at last visit in large part d/t running out of meds and dietary indiscretion. He has adjusted to his insulin well and denies any current side effects.    Family/Social History:  Fhx: no pertinent positives Tobacco: former smoker (quit in 2022) Alcohol: none reported  Current diabetes medications include: Lantus 15u daily, Humalog 4u TID with meals, metformin 500 mg BID (stopped d/t intolerance but he plans to restart this) Current hyperlipidemia medications include: rosuvastatin 10 mg daily    Patient reports adherence to taking all medications as prescribed.   Insurance coverage: [place] [employer]  Patient denies hypoglycemic events.  Reported home fasting blood sugars: low 200s - 240s.    Patient reports polyuria, polydipsia.  Patient denies neuropathy (nerve pain). Patient reports improvement in vision. Patient denies self foot exams.   Patient reported dietary habits: Eats 3 meals/day -Tries to limit carbs and starches now. Plans to increase salads and non-starchy vegetables.  -Admits to dietary indiscretion before his most recent PCP visit last month.   Patient-reported exercise habits:  - Walking ~1 mile a day in the morning   O:   Lab Results  Component Value Date   HGBA1C 13.1 (A) 02/26/2023   There were no vitals filed for this visit.  Lipid Panel     Component Value Date/Time   CHOL 164 07/06/2022 0610   TRIG  332 (H) 07/06/2022 0610   HDL 25 (L) 07/06/2022 0610   CHOLHDL 6.6 07/06/2022 0610   VLDL 66 (H) 07/06/2022 0610   LDLCALC 73 07/06/2022 0610    Clinical Atherosclerotic Cardiovascular Disease (ASCVD): No  The 10-year ASCVD risk score (Arnett DK, et al., 2019) is: 7.4%   Values used to calculate the score:     Age: 66 years     Sex: Male     Is Non-Hispanic African American: Yes     Diabetic: Yes     Tobacco smoker: No     Systolic Blood Pressure: 122 mmHg     Is BP treated: No     HDL Cholesterol: 25 mg/dL     Total Cholesterol: 164 mg/dL   Patient is participating in a Managed Medicaid Plan:  No   A/P: Diabetes longstanding currently uncontrolled. Hyperglycemia-associated symptoms have improved. No hypoglycemia currently. Patient is able to verbalize appropriate hypoglycemia management plan. Medication adherence appears appropriate. -Increase Lantus to 20u daily.  -Continue Humalog 4u TID before meals. -Restart metformin 500 mg BID. -Encouraged patient to pick-up [place] supplies and he plans to do so. -Extensively discussed pathophysiology of diabetes, recommended lifestyle interventions, dietary effects on blood sugar control.  -Counseled on s/sx of and management of hypoglycemia.  -Next A1c anticipated 05/2023.   Written patient instructions provided. Patient verbalized understanding of treatment plan.  Total time in face to face counseling 20 minutes.    Follow-up:  Pharmacist in 1 month. PCP clinic visit in 05/2023.    Butch Penny, PharmD, BCACP, CPP Clinical  Pharmacist [facility] [phone]

## 2023-04-27 ENCOUNTER — Ambulatory Visit: Payer: Medicaid Other | Admitting: Pharmacist

## 2023-04-27 NOTE — Progress Notes (Deleted)
S:     No chief complaint on file.  42 y.o. male who presents for diabetes evaluation, education, and management.  PMH is significant for T2DM, PTSD, MDD.  Patient was referred and last seen by Primary Care Provider, Dr. Laural Benes, on 02/26/2023. A1c was 13.1%, up from 6.9% in December. Blood sugars were elevated >300s. Semglee 15 units daily and Novolog 4 units TID was started. Last seen by pharmacy clinic on 03/26/2023. At last visit, switched to Lantus 20 units daily.  Today, patient arrives in *** good spirits and presents without *** any assistance. ***  Patient reports Diabetes was diagnosed in ***.   Family/Social History:  -Tobacco: former smoker (quit 2022)  Current diabetes medications include: Lantus 20 units daily, humalog 4 units TID, metformin 500 mg BID Current hypertension medications include: *** Current hyperlipidemia medications include: rosuvastatin 10 mg daily  Patient reports adherence to taking all medications as prescribed.  *** Patient denies adherence with medications, reports missing *** medications *** times per week, on average.  Do you feel that your medications are working for you? {YES NO:22349} Have you been experiencing any side effects to the medications prescribed? {YES NO:22349} Do you have any problems obtaining medications due to transportation or finances? {YES [id number] Insurance coverage: [place] [employer]  Patient {Actions; denies-reports:120008} hypoglycemic events.  Reported home fasting blood sugars: ***  Reported 2 hour post-meal/random blood sugars: ***.  Patient {Actions; denies-reports:120008} nocturia (nighttime urination).  Patient {Actions; denies-reports:120008} neuropathy (nerve pain). Patient {Actions; denies-reports:120008} visual changes. Patient {Actions; denies-reports:120008} self foot exams.   Patient reported dietary habits: Eats *** meals/day Breakfast: *** Lunch: *** Dinner: *** Snacks: *** Drinks: ***  Within  the past 12 months, did you worry whether your food would run out before you got money to buy more? {YES NO:22349} Within the past 12 months, did the food you bought run out, and you didn't have money to get more? {YES NO:22349} PHQ-9 Score: ***  Patient-reported exercise habits: ***   O:   ROS  Physical Exam  7 day average blood glucose: ***  *** CGM Download:  % Time CGM is active: ***% Average Glucose: *** mg/dL Glucose Management Indicator: ***  Glucose Variability: *** (goal <36%) Time in Goal:  - Time in range 70-180: ***% - Time above range: ***% - Time below range: ***% Observed patterns:   Lab Results  Component Value Date   HGBA1C 13.1 (A) 02/26/2023   There were no vitals filed for this visit.  Lipid Panel     Component Value Date/Time   CHOL 164 07/06/2022 0610   TRIG 332 (H) 07/06/2022 0610   HDL 25 (L) 07/06/2022 0610   CHOLHDL 6.6 07/06/2022 0610   VLDL 66 (H) 07/06/2022 0610   LDLCALC 73 07/06/2022 0610    Clinical Atherosclerotic Cardiovascular Disease (ASCVD): {YES/NO:21197} The 10-year ASCVD risk score (Arnett DK, et al., 2019) is: 7.4%   Values used to calculate the score:     Age: 73 years     Sex: Male     Is Non-Hispanic African American: Yes     Diabetic: Yes     Tobacco smoker: No     Systolic Blood Pressure: 122 mmHg     Is BP treated: No     HDL Cholesterol: 25 mg/dL     Total Cholesterol: 164 mg/dL   Patient is participating in a Managed Medicaid Plan:  {MM YES/NO:27447::"Yes"}   A/P: Diabetes longstanding *** currently ***. Patient is *** able to  verbalize appropriate hypoglycemia management plan. Medication adherence appears ***. Control is suboptimal due to ***. -{Meds adjust:18428} basal insulin *** Lantus/Basaglar/Semglee (insulin glargine) *** Tresiba (insulin degludec) from *** units to *** units daily in the morning. Patient will continue to titrate 1 unit every *** days if fasting blood sugar > 100mg /dl until fasting  blood sugars reach goal or next visit.  -{Meds adjust:18428} rapid insulin *** Novolog (insulin aspart) *** Humalog (insulin lispro) from *** to ***.  -{Meds adjust:18428} GLP-1 *** Trulicity (dulaglutide) *** Ozempic (semaglutide) *** Mounjaro (tirzepatide) from *** mg to *** mg .  -{Meds adjust:18428} SGLT2-I *** Farxiga (dapagliflozin) *** Jardiance (empagliflozin) 10 mg. Counseled on sick day rules. -{Meds adjust:18428} metformin ***.  -Patient educated on purpose, proper use, and potential adverse effects of ***.  -Extensively discussed pathophysiology of diabetes, recommended lifestyle interventions, dietary effects on blood sugar control.  -Counseled on s/sx of and management of hypoglycemia.  -Next A1c anticipated ***.   ASCVD risk - primary ***secondary prevention in patient with diabetes. Last LDL is *** not at goal of <21 *** mg/dL. ASCVD risk factors include *** and 10-year ASCVD risk score of ***. {Desc; low/moderate/high:110033} intensity statin indicated.  -{Meds adjust:18428} ***statin *** mg.   Hypertension longstanding *** currently ***. Blood pressure goal of <130/80 *** mmHg. Medication adherence ***. Blood pressure control is suboptimal due to ***. -{Meds adjust:18428} *** mg.  Written patient instructions provided. Patient verbalized understanding of treatment plan.  Total time in face to face counseling *** minutes.    Follow-up:  Pharmacist ***. PCP clinic visit in ***.  Patient seen with ***

## 2023-05-26 ENCOUNTER — Telehealth: Payer: Self-pay | Admitting: Internal Medicine

## 2023-05-26 NOTE — Telephone Encounter (Signed)
Called & spoke to the patient. Verified name & DOB. Informed patient that blood type testing is not available. Patient expressed verbal understanding.

## 2023-05-26 NOTE — Telephone Encounter (Signed)
Copied from CRM [id number]. Topic: General - Other >> May 26, 2023  3:12 PM Jeffery Santos wrote: Reason for CRM: Pt is calling about his blood type, wants to know how he can learn this information.

## 2023-05-28 ENCOUNTER — Ambulatory Visit: Payer: MEDICAID | Admitting: Internal Medicine

## 2023-05-28 DIAGNOSIS — R030 Elevated blood-pressure reading, without diagnosis of hypertension: Secondary | ICD-10-CM

## 2023-05-28 DIAGNOSIS — Z794 Long term (current) use of insulin: Secondary | ICD-10-CM

## 2023-06-01 ENCOUNTER — Ambulatory Visit: Payer: MEDICAID | Attending: Internal Medicine | Admitting: Internal Medicine

## 2023-06-01 VITALS — BP 134/80 | HR 82 | Temp 98.5°F | Ht 71.0 in | Wt 266.0 lb

## 2023-06-01 DIAGNOSIS — R030 Elevated blood-pressure reading, without diagnosis of hypertension: Secondary | ICD-10-CM

## 2023-06-01 DIAGNOSIS — E1169 Type 2 diabetes mellitus with other specified complication: Secondary | ICD-10-CM | POA: Diagnosis not present

## 2023-06-01 DIAGNOSIS — E669 Obesity, unspecified: Secondary | ICD-10-CM

## 2023-06-01 DIAGNOSIS — Z7984 Long term (current) use of oral hypoglycemic drugs: Secondary | ICD-10-CM

## 2023-06-01 DIAGNOSIS — Z794 Long term (current) use of insulin: Secondary | ICD-10-CM

## 2023-06-01 DIAGNOSIS — Z91148 Patient's other noncompliance with medication regimen for other reason: Secondary | ICD-10-CM | POA: Diagnosis not present

## 2023-06-01 LAB — POCT GLYCOSYLATED HEMOGLOBIN (HGB A1C): HbA1c, POC (controlled diabetic range): 11.8 % — AB (ref 0.0–7.0)

## 2023-06-01 LAB — GLUCOSE, POCT (MANUAL RESULT ENTRY): POC Glucose: 300 mg/dl — AB (ref 70–99)

## 2023-06-01 MED ORDER — LANTUS SOLOSTAR 100 UNIT/ML ~~LOC~~ SOPN
20.0000 [IU] | PEN_INJECTOR | Freq: Every day | SUBCUTANEOUS | 1 refills | Status: DC
Start: 2023-06-01 — End: 2023-09-07

## 2023-06-01 MED ORDER — INSULIN LISPRO (1 UNIT DIAL) 100 UNIT/ML (KWIKPEN)
4.0000 [IU] | PEN_INJECTOR | Freq: Three times a day (TID) | SUBCUTANEOUS | 0 refills | Status: DC
Start: 2023-06-01 — End: 2023-09-07

## 2023-06-01 MED ORDER — FREESTYLE LIBRE 3 SENSOR MISC
6 refills | Status: DC
Start: 2023-06-01 — End: 2023-11-20

## 2023-06-01 MED ORDER — PEN NEEDLES 31G X 8 MM MISC
6 refills | Status: DC
Start: 2023-06-01 — End: 2023-09-08

## 2023-06-01 MED ORDER — ROSUVASTATIN CALCIUM 10 MG PO TABS
10.0000 mg | ORAL_TABLET | Freq: Every day | ORAL | 1 refills | Status: DC
Start: 2023-06-01 — End: 2023-11-20

## 2023-06-01 MED ORDER — FREESTYLE LIBRE 3 READER DEVI
1.0000 | Freq: Every day | 0 refills | Status: DC
Start: 2023-06-01 — End: 2023-12-20
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
## 2023-06-01 NOTE — Patient Instructions (Signed)
Restart Metformin 500 mg at least taking it once a day. Take your insulins as prescribed.  You will be called with appointment to see the nutritionist.

## 2023-09-07 ENCOUNTER — Other Ambulatory Visit: Payer: Self-pay | Admitting: Pharmacist

## 2023-09-07 DIAGNOSIS — E1165 Type 2 diabetes mellitus with hyperglycemia: Secondary | ICD-10-CM

## 2023-09-07 DIAGNOSIS — Z794 Long term (current) use of insulin: Secondary | ICD-10-CM

## 2023-09-07 DIAGNOSIS — E669 Obesity, unspecified: Secondary | ICD-10-CM

## 2023-09-07 MED ORDER — LANTUS SOLOSTAR 100 UNIT/ML ~~LOC~~ SOPN
20.0000 [IU] | PEN_INJECTOR | Freq: Every day | SUBCUTANEOUS | 0 refills | Status: DC
Start: 2023-09-07 — End: 2023-11-20

## 2023-09-07 MED ORDER — ACCU-CHEK GUIDE VI STRP: ORAL_STRIP | 0 refills | Status: DC

## 2023-09-07 MED ORDER — ACCU-CHEK SOFTCLIX LANCETS MISC: 0 refills | Status: DC

## 2023-09-07 MED ORDER — INSULIN LISPRO (1 UNIT DIAL) 100 UNIT/ML (KWIKPEN): 4.0000 [IU] | PEN_INJECTOR | Freq: Three times a day (TID) | SUBCUTANEOUS | 0 refills | Status: DC

## 2023-09-07 MED ORDER — ACCU-CHEK GUIDE W/DEVICE KIT: PACK | 0 refills | Status: DC

## 2023-09-08 ENCOUNTER — Other Ambulatory Visit: Payer: Self-pay

## 2023-09-08 DIAGNOSIS — E1169 Type 2 diabetes mellitus with other specified complication: Secondary | ICD-10-CM

## 2023-09-08 MED ORDER — PEN NEEDLES 31G X 8 MM MISC: 6 refills | Status: DC

## 2023-09-30 ENCOUNTER — Other Ambulatory Visit: Payer: Self-pay | Admitting: Internal Medicine

## 2023-09-30 DIAGNOSIS — E1169 Type 2 diabetes mellitus with other specified complication: Secondary | ICD-10-CM

## 2023-10-06 ENCOUNTER — Ambulatory Visit: Payer: MEDICAID | Admitting: Internal Medicine

## 2023-10-22 ENCOUNTER — Ambulatory Visit (HOSPITAL_COMMUNITY): Admission: EM | Admit: 2023-10-22 | Discharge: 2023-10-22 | Discharge status: Against Medical Advice | Payer: MEDICAID

## 2023-10-22 NOTE — ED Notes (Signed)
This RN called pt from lobby to treatment room with no answer x 5. LWBS.

## 2023-10-22 NOTE — ED Triage Notes (Signed)
PT called 4 times with no answer from front lobby

## 2023-11-04 ENCOUNTER — Ambulatory Visit (HOSPITAL_COMMUNITY): Payer: MEDICAID

## 2023-11-20 ENCOUNTER — Ambulatory Visit: Payer: MEDICAID | Attending: Internal Medicine | Admitting: Internal Medicine

## 2023-11-20 ENCOUNTER — Encounter: Payer: Self-pay | Admitting: Internal Medicine

## 2023-11-20 VITALS — BP 135/90 | HR 82 | Temp 98.4°F | Ht 71.0 in

## 2023-11-20 DIAGNOSIS — E1159 Type 2 diabetes mellitus with other circulatory complications: Secondary | ICD-10-CM

## 2023-11-20 DIAGNOSIS — I152 Hypertension secondary to endocrine disorders: Secondary | ICD-10-CM

## 2023-11-20 DIAGNOSIS — E1165 Type 2 diabetes mellitus with hyperglycemia: Secondary | ICD-10-CM | POA: Diagnosis not present

## 2023-11-20 DIAGNOSIS — E66812 Obesity, class 2: Secondary | ICD-10-CM

## 2023-11-20 DIAGNOSIS — Z7984 Long term (current) use of oral hypoglycemic drugs: Secondary | ICD-10-CM

## 2023-11-20 DIAGNOSIS — Z794 Long term (current) use of insulin: Secondary | ICD-10-CM

## 2023-11-20 DIAGNOSIS — Z6837 Body mass index (BMI) 37.0-37.9, adult: Secondary | ICD-10-CM

## 2023-11-20 LAB — POCT GLYCOSYLATED HEMOGLOBIN (HGB A1C): HbA1c, POC (controlled diabetic range): 13.8 % — AB (ref 0.0–7.0)

## 2023-11-20 LAB — GLUCOSE, POCT (MANUAL RESULT ENTRY): POC Glucose: 439 mg/dL — AB (ref 70–99)

## 2023-11-20 MED ORDER — LANTUS SOLOSTAR 100 UNIT/ML ~~LOC~~ SOPN: 20.0000 [IU] | PEN_INJECTOR | Freq: Every day | SUBCUTANEOUS | 6 refills | Status: DC

## 2023-11-20 MED ORDER — FREESTYLE LIBRE 3 PLUS SENSOR MISC: 6 refills | Status: DC

## 2023-11-20 MED ORDER — ACCU-CHEK SOFTCLIX LANCETS MISC: 3 refills | Status: DC

## 2023-11-20 MED ORDER — ROSUVASTATIN CALCIUM 10 MG PO TABS: 10.0000 mg | ORAL_TABLET | Freq: Every day | ORAL | 1 refills | Status: DC

## 2023-11-20 MED ORDER — VALSARTAN 40 MG PO TABS: 40.0000 mg | ORAL_TABLET | Freq: Every day | ORAL | 3 refills | Status: DC

## 2023-11-20 MED ORDER — INSULIN LISPRO (1 UNIT DIAL) 100 UNIT/ML (KWIKPEN): 5.0000 [IU] | PEN_INJECTOR | Freq: Three times a day (TID) | SUBCUTANEOUS | 4 refills | Status: DC

## 2023-11-20 MED ORDER — METFORMIN HCL 500 MG PO TABS: 500.0000 mg | ORAL_TABLET | Freq: Two times a day (BID) | ORAL | 1 refills | Status: DC
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
## 2023-11-20 NOTE — Patient Instructions (Signed)
Please restart your mealtime insulin Humalog 5 units with meals and the long-acting insulin glargine 20 units daily.  I have sent a prescription to your pharmacy for the continuous glucose monitor called the freestyle libre device.  Once you get it, he will need to download the app onto your iPhone.  We have started you on a blood pressure medicine called valsartan 40 mg daily.  Please do not start this medication until I get the results of your blood test that will be ordered today.  We will have the results back tomorrow and we will call you with the results.  We will have you follow-up with one of our clinical pharmacist in about 1 month to recheck you in regards to your diabetes.

## 2023-11-22 LAB — CBC
Hematocrit: 48.4 % (ref 37.5–51.0)
Hemoglobin: 15.9 g/dL (ref 13.0–17.7)
MCH: 27.1 pg (ref 26.6–33.0)
MCHC: 32.9 g/dL (ref 31.5–35.7)
MCV: 83 fL (ref 79–97)
Platelets: 270 10*3/uL (ref 150–450)
RBC: 5.86 x10E6/uL — ABNORMAL HIGH (ref 4.14–5.80)
RDW: 12.9 % (ref 11.6–15.4)
WBC: 4.7 10*3/uL (ref 3.4–10.8)

## 2023-11-22 LAB — LIPID PANEL
Chol/HDL Ratio: 8 {ratio} — ABNORMAL HIGH (ref 0.0–5.0)
Cholesterol, Total: 233 mg/dL — ABNORMAL HIGH (ref 100–199)
HDL: 29 mg/dL — ABNORMAL LOW (ref >39)
Triglycerides: 989 mg/dL (ref 0–149)

## 2023-11-22 LAB — COMPREHENSIVE METABOLIC PANEL
ALT: 65 [IU]/L — ABNORMAL HIGH (ref 0–44)
AST: 32 [IU]/L (ref 0–40)
Albumin: 4.5 g/dL (ref 4.1–5.1)
Alkaline Phosphatase: 126 [IU]/L — ABNORMAL HIGH (ref 44–121)
BUN/Creatinine Ratio: 10 (ref 9–20)
BUN: 11 mg/dL (ref 6–24)
Bilirubin Total: 0.2 mg/dL (ref 0.0–1.2)
CO2: 19 mmol/L — ABNORMAL LOW (ref 20–29)
Calcium: 9.8 mg/dL (ref 8.7–10.2)
Chloride: 93 mmol/L — ABNORMAL LOW (ref 96–106)
Creatinine, Ser: 1.07 mg/dL (ref 0.76–1.27)
Globulin, Total: 3.7 g/dL (ref 1.5–4.5)
Glucose: 403 mg/dL — ABNORMAL HIGH (ref 70–99)
Potassium: 4.9 mmol/L (ref 3.5–5.2)
Sodium: 133 mmol/L — ABNORMAL LOW (ref 134–144)
Total Protein: 8.2 g/dL (ref 6.0–8.5)
eGFR: 88 mL/min/{1.73_m2} (ref >59)

## 2023-11-22 LAB — MICROALBUMIN / CREATININE URINE RATIO
Creatinine, Urine: 34.6 mg/dL
Microalb/Creat Ratio: 34 mg/g{creat} — ABNORMAL HIGH (ref 0–29)
Microalbumin, Urine: 11.7 ug/mL

## 2023-11-22 NOTE — Progress Notes (Signed)
Cholesterol and triglyceride levels are elevated. Restart Rosuvastatin as prescribed. Healthy eating and regular exercise will also help. Blood cell counts normal. Kidney function stable. Has small amounts of protein in the urine due to DM. Start the blood pressure medication valsartan as prescribed. Has mild elev in 2 of his liver function test which we will observe for now. He forgot to stop at front desk to get f/u appts.  Please give appt with clinical pharmacist in 1 mth for recheck DM and with me in 4 mths.

## 2023-12-15 ENCOUNTER — Telehealth: Payer: Self-pay

## 2023-12-15 NOTE — Telephone Encounter (Signed)
Patient is requesting a call from PCP regarding what his intake of calcium should be daily.

## 2023-12-17 NOTE — Telephone Encounter (Signed)
Does not need calcium supplement.  Calcium level is good.

## 2023-12-20 ENCOUNTER — Ambulatory Visit (HOSPITAL_COMMUNITY)
Admission: EM | Admit: 2023-12-20 | Discharge: 2023-12-20 | Disposition: A | Discharge status: Home / Self Care | Payer: MEDICAID | Attending: Emergency Medicine | Admitting: Emergency Medicine

## 2023-12-20 ENCOUNTER — Encounter (HOSPITAL_COMMUNITY): Payer: Self-pay

## 2023-12-20 DIAGNOSIS — N481 Balanitis: Secondary | ICD-10-CM | POA: Diagnosis present

## 2023-12-20 DIAGNOSIS — N4889 Other specified disorders of penis: Secondary | ICD-10-CM | POA: Insufficient documentation

## 2023-12-20 MED ORDER — KETOCONAZOLE 2 % EX CREA: 1.0000 | TOPICAL_CREAM | Freq: Two times a day (BID) | CUTANEOUS | 0 refills | Status: DC

## 2023-12-20 NOTE — ED Provider Notes (Signed)
[facility]    CSN: [id number] Arrival date & time: 12/20/23  1125      History   Chief Complaint Chief Complaint  Patient presents with   Penis Pain   Rash    HPI Jeffery Santos is a 43 y.o. male.   Patient presents to clinic for redness, swelling and discomfort around the shaft of his penis.  Reports this has happened before and they gave him ketoconazole cream with good relief.  He did also take medicine for herpes simplex virus.  He has never been tested for this or had any confirmed diagnosis of this.  For the past month he has had redness around the head of his penis.  He is uncircumcised and reports that despite cleaning himself frequently the rash has persisted.  He has some fissures and irritation along the shaft. Denies discharge from urethra. W/o dysuria.  Hx of DM2.   The history is provided by the patient and medical records.  Penis Pain  Rash   Past Medical History:  Diagnosis Date   Acid reflux    Diabetes mellitus without complication [facility])     Patient Active Problem List   Diagnosis Date Noted   Type 2 diabetes mellitus with obesity (HCC) 06/01/2023   Major depressive disorder, single episode, mild (HCC) 10/06/2022   Hyperglycemia due to diabetes mellitus (HCC) 07/05/2022   PTSD (post-traumatic stress disorder) 07/05/2022    Past Surgical History:  Procedure Laterality Date   ANKLE ARTHROSCOPY WITH OPEN REDUCTION INTERNAL FIXATION (ORIF)     HERNIA REPAIR Left 2001   WOUND EXPLORATION Left 05/01/2021   Procedure: WOUND EXPLORATION-Left small finger wound exploration and tendon repair;  Surgeon: Bradly Bienenstock, MD;  Location: [facility] OR;  Service: Orthopedics;  Laterality: Left;       Home Medications    Prior to Admission medications   Medication Sig Start Date End Date Taking? Authorizing Provider  ketoconazole (NIZORAL) 2 % cream Apply 1 Application topically 2 (two) times daily. 12/20/23  Yes Rinaldo Ratel, [place] N, FNP  glucose blood  (ACCU-CHEK GUIDE) test strip Use as instructed to check blood sugar three times daily. 09/07/23   Marcine Matar, MD  insulin glargine (LANTUS SOLOSTAR) 100 UNIT/ML Solostar Pen Inject 20 Units into the skin daily. 11/20/23   Marcine Matar, MD  insulin lispro (HUMALOG KWIKPEN) 100 UNIT/ML KwikPen Inject 5 Units into the skin 3 (three) times daily. 11/20/23   Marcine Matar, MD  metFORMIN (GLUCOPHAGE) 500 MG tablet Take 1 tablet (500 mg total) by mouth 2 (two) times daily with a meal. 11/20/23   Marcine Matar, MD  ARIPiprazole (ABILIFY) 10 MG tablet Take by mouth.  11/17/20  [provider]  calcium carbonate (OS-CAL) 1250 (500 Ca) MG chewable tablet Chew by mouth. 05/21/17 01/28/20  [provider]  cloNIDine (CATAPRES) 0.1 MG tablet Take by mouth.  01/28/20  [provider]  FLUoxetine (PROZAC) 20 MG capsule Take by mouth.  01/28/20  [provider]  gabapentin (NEURONTIN) 100 MG capsule Take by mouth. 05/21/17 12/12/19  [provider]  promethazine (PHENERGAN) 12.5 MG tablet Take by mouth. 05/21/17 12/12/19  [provider]    Family History Family History  Problem Relation Age of Onset   Healthy Mother    Healthy Father     Social History Social History   Tobacco Use   Smoking status: Former    Current packs/day: 0.00    Average packs/day: 0.5 packs/day for 13.0  years (6.5 ttl pk-yrs)    Types: Cigarettes    Start date: 10/2008    Quit date: 10/2021    Years since quitting: 2.2   Smokeless tobacco: Never  Vaping Use   Vaping status: Never Used  Substance Use Topics   Alcohol use: Yes    Comment: occasionally   Drug use: No     Allergies   Hydrocodone-acetaminophen   Review of Systems Review of Systems  Per HPI   Physical Exam Triage Vital Signs ED Triage Vitals  Encounter Vitals Group     BP 12/20/23 1145 127/79     Systolic BP Percentile --      Diastolic BP Percentile --      Pulse Rate 12/20/23  1145 83     Resp 12/20/23 1145 18     Temp 12/20/23 1145 98.6 F (37 C)     Temp Source 12/20/23 1145 Oral     SpO2 12/20/23 1145 96 %     Weight 12/20/23 1144 254 lb 12.8 oz (115.6 kg)     Height 12/20/23 1144 5\' 11"  (1.803 m)     Head Circumference --      Peak Flow --      Pain Score 12/20/23 1139 4     Pain Loc --      Pain Education --      Exclude from Growth Chart --    No data found.  Updated Vital Signs BP 127/79 (BP Location: Right Arm)   Pulse 83   Temp 98.6 F (37 C) (Oral)   Resp 18   Ht 5\' 11"  (1.803 m)   Wt 254 lb 12.8 oz (115.6 kg)   SpO2 96%   BMI 35.54 kg/m   Visual Acuity Right Eye Distance:   Left Eye Distance:   Bilateral Distance:    Right Eye Near:   Left Eye Near:    Bilateral Near:     Physical Exam Vitals and nursing note reviewed. Exam conducted with a chaperone present.  Constitutional:      Appearance: Normal appearance.  HENT:     Head: Normocephalic and atraumatic.     Right Ear: External ear normal.     Left Ear: External ear normal.     Nose: Nose normal.     Mouth/Throat:     Mouth: Mucous membranes are moist.  Eyes:     Conjunctiva/sclera: Conjunctivae normal.  Cardiovascular:     Rate and Rhythm: Normal rate.  Pulmonary:     Effort: Pulmonary effort is normal. No respiratory distress.  Genitourinary:    Penis: Uncircumcised. Erythema present.        Comments: Redness, erythema and moisture noticed along the shaft of the penis.  There is some fissure lines with irritation.  No grouped vesicular lesions. Musculoskeletal:        General: Normal range of motion.  Skin:    General: Skin is warm.  Neurological:     General: No focal deficit present.     Mental Status: He is alert.  Psychiatric:        Mood and Affect: Mood normal.      UC Treatments / Results  Labs (all labs ordered are listed, but only abnormal results are displayed) Labs Reviewed  HSV CULTURE AND TYPING    EKG   Radiology No results  found.  Procedures Procedures (including critical care time)  Medications Ordered in UC Medications - No data to display  Initial Impression / Assessment  and Plan / UC Course  I have reviewed the triage vital signs and the nursing notes.  Pertinent labs & imaging results that were available during my care of the patient were reviewed by me and considered in my medical decision making (see chart for details).  Vitals and triage reviewed, patient is hemodynamically stable.  Chaperone present for physical exam reveals moist erythematous rash with some fissures along the shaft of his penis.  Uncircumcised.  Has been ongoing for the past month, no grouped vesicular lesions, and consistent with herpes simplex virus.  Consistent with balanitis, will treat with ketoconazole cream.  Discussed this does not appear consistent with herpes simplex virus, viral testing obtained.  Patient voices that he would like to be circumcised, provided with information to urology.  Plan of care, follow-up care return precautions given, no questions at this time.    Final Clinical Impressions(s) / UC Diagnoses   Final diagnoses:  Balanitis  Penile irritation     Discharge Instructions      Your physical exam was consistent with a genital yeast infection.  Please apply the ketoconazole cream twice daily.  Ensure you are keeping this area clean and dry.  Your type 2 diabetes may be contributing to the infection, as this can alter your pH and make you more at risk for infections.  We have swabbed the area for HSV and we will contact you if the results are abnormal.  If you would like to follow-up about circumcision, please follow-up with a urologist.    ED Prescriptions     Medication Sig Dispense Auth. Provider   ketoconazole (NIZORAL) 2 % cream Apply 1 Application topically 2 (two) times daily. 30 g Temple Ewart, [place] N, [place]      PDMP not reviewed this encounter.   Eutha Cude, [place] N, [place] 12/20/23  1213

## 2023-12-20 NOTE — ED Triage Notes (Signed)
Patient states having discomfort around his private area. States he was seen for it before but symptoms are returning. States a year ago there was a pill and cream that got rid of the symptoms.   Onset 1 month ago. A redness around the head of the penis.

## 2023-12-20 NOTE — Discharge Instructions (Signed)
Your physical exam was consistent with a genital yeast infection.  Please apply the ketoconazole cream twice daily.  Ensure you are keeping this area clean and dry.  Your type 2 diabetes may be contributing to the infection, as this can alter your pH and make you more at risk for infections.  We have swabbed the area for HSV and we will contact you if the results are abnormal.  If you would like to follow-up about circumcision, please follow-up with a urologist.

## 2023-12-22 NOTE — Telephone Encounter (Signed)
Called but no answer. LVM informing patient that calcium levels are normal and there is no need for a calcium supplement.

## 2023-12-23 ENCOUNTER — Other Ambulatory Visit: Payer: Self-pay

## 2023-12-23 LAB — HSV CULTURE AND TYPING

## 2023-12-23 LAB — GLUCOSE, POCT (MANUAL RESULT ENTRY): POC Glucose: 157 mg/dL — AB (ref 70–99)

## 2023-12-23 NOTE — Progress Notes (Unsigned)
S:     No chief complaint on file.  43 y.o. male with PMHx significant for T2DM, HLD, PTSD, and history of tobacco use who presents for diabetes evaluation, education, and management.    Patient was referred and last seen by Primary Care Provider, Dr. Jonah Blue, on 11/20/2023. At that visit A1c was found to be increased to 13.8 from 11 in July 2024 (last time A1c was at goal was in December 2023). Patient reported poor medication adherence to Dr. Laural Benes over 3-4 months. He admitted to dietary indiscretion and reduced physical activity. He also reported polyuria.   *** Patient was referred by *** on ***. Patient was last seen by Primary Care Provider, Dr. ***, on ***.    Patient arrives in *** good spirits and presents without *** any assistance. ***Patient is accompanied by ***.   At last visit, ***.   Patient reports Diabetes was diagnosed in 2023.   Family/Social History: ***  Current diabetes medications include:  -Lantus 20 units daily -Humalog 5 units with meals TID  -Metformin 500 mg BID   Current hypertension medications include:  -Valsartan 40 mg daily (per last PCP note but no active prescription)  Current hyperlipidemia medications include:  -Rosuvastatin 10 mg daily (per last PCP note but no active prescription)   Patient reports adherence to taking all medications as prescribed.  *** Patient denies adherence with medications, reports missing *** medications *** times per week, on average.  Do you feel that your medications are working for you? {YES NO:22349} Have you been experiencing any side effects to the medications prescribed? {YES NO:22349} Do you have any problems obtaining medications due to transportation or finances? {YES [id number] Insurance coverage: ***  Patient {Actions; denies-reports:120008} hypoglycemic events.  Reported home fasting blood sugars: ***  Reported 2 hour post-meal/random blood sugars: ***.  Patient {Actions;  denies-reports:120008} nocturia (nighttime urination).  Patient {Actions; denies-reports:120008} neuropathy (nerve pain). Patient {Actions; denies-reports:120008} visual changes. Patient {Actions; denies-reports:120008} self foot exams.   Patient reported dietary habits: Eats *** meals/day Breakfast: *** Lunch: *** Dinner: *** Snacks: *** Drinks: ***  Within the past 12 months, did you worry whether your food would run out before you got money to buy more? {YES NO:22349} Within the past 12 months, did the food you bought run out, and you didn't have money to get more? {YES NO:22349} PHQ-9 Score: ***  Patient-reported exercise habits: ***   O:     7 day average blood glucose: ***  Libre3 CGM Download today *** % Time CGM is active: ***% Average Glucose: *** mg/dL Glucose Management Indicator: ***  Glucose Variability: ***% (goal <36%) Time in Goal:  - Time in range 70-180: ***% - Time above range: ***% - Time below range: ***% Observed patterns:   Lab Results  Component Value Date   HGBA1C 13.8 (A) 11/20/2023   There were no vitals filed for this visit.  Lipid Panel     Component Value Date/Time   CHOL 233 (H) 11/20/2023 1204   TRIG 989 (HH) 11/20/2023 1204   HDL 29 (L) 11/20/2023 1204   CHOLHDL 8.0 (H) 11/20/2023 1204   CHOLHDL 6.6 07/06/2022 0610   VLDL 66 (H) 07/06/2022 0610   LDLCALC Comment (A) 11/20/2023 1204    Clinical Atherosclerotic Cardiovascular Disease (ASCVD): No  The 10-year ASCVD risk score (Arnett DK, et al., 2019) is: 8.4%   Values used to calculate the score:     Age: 89 years     Sex:  Male     Is Non-Hispanic African American: Yes     Diabetic: Yes     Tobacco smoker: No     Systolic Blood Pressure: 127 mmHg     Is BP treated: No     HDL Cholesterol: 29 mg/dL     Total Cholesterol: 233 mg/dL   Patient is participating in a Managed Medicaid Plan:  No   A/P: Diabetes longstanding *** currently ***. Patient is *** able to  verbalize appropriate hypoglycemia management plan. Medication adherence appears ***. Control is suboptimal due to ***. -{Meds adjust:18428} basal insulin *** Lantus/Basaglar/Semglee (insulin glargine) *** Tresiba (insulin degludec) from *** units to *** units daily in the morning. Patient will continue to titrate 1 unit every *** days if fasting blood sugar > 100mg /dl until fasting blood sugars reach goal or next visit.  -{Meds adjust:18428} rapid insulin *** Novolog (insulin aspart) *** Humalog (insulin lispro) from *** to ***.  -{Meds adjust:18428} GLP-1 *** Trulicity (dulaglutide) *** Ozempic (semaglutide) *** Mounjaro (tirzepatide) from *** mg to *** mg .  -{Meds adjust:18428} SGLT2-I *** Farxiga (dapagliflozin) *** Jardiance (empagliflozin) 10 mg. Counseled on sick day rules. -{Meds adjust:18428} metformin ***.  -Patient educated on purpose, proper use, and potential adverse effects of ***.  -Extensively discussed pathophysiology of diabetes, recommended lifestyle interventions, dietary effects on blood sugar control.  -Counseled on s/sx of and management of hypoglycemia.  -Next A1c anticipated April 2025.   ASCVD risk - primary ***secondary prevention in patient with diabetes. Last LDL is *** not at goal of <29 *** mg/dL. ASCVD risk factors include *** and 10-year ASCVD risk score of ***. {Desc; low/moderate/high:110033} intensity statin indicated.  -{Meds adjust:18428} ***statin *** mg.   Hypertension longstanding *** currently ***. Blood pressure goal of <130/80 *** mmHg. Medication adherence ***. Blood pressure control is suboptimal due to ***. -{Meds adjust:18428} *** mg.  Written patient instructions provided. Patient verbalized understanding of treatment plan.  Total time in face to face counseling *** minutes.    Follow-up:  Pharmacist *** PCP clinic visit in *** Patient seen with ***

## 2023-12-23 NOTE — Congregational Nurse Program (Signed)
  Dept: [phone]   Congregational Nurse Program Note  Date of Encounter: 12/23/2023  Past Medical History: Past Medical History:  Diagnosis Date   Acid reflux    Diabetes mellitus without complication [facility])     Encounter Details:  Community Questionnaire - 12/23/23 0959       Questionnaire   Ask client: Do you give verbal consent for me to treat you today? Yes    Student Assistance N/A    Location Patient Served  [facility]    Encounter Setting CN site    Population Status Unknown   Private Residence   Insurance Unknown    Insurance/Financial Assistance Referral N/A    Medication Patient Medications Reviewed;Provided Medication Assistance    Medical Provider Yes    Screening Referrals Made N/A    Medical Referrals Made N/A    Medical Appointment Completed N/A    CNP Interventions Educate;Case Management;Navigate Healthcare System;Advocate/Support;Counsel    Screenings CN Performed Blood Pressure;Blood Glucose    ED Visit Averted N/A    Life-Saving Intervention Made N/A            Jeffery Santos to clinic states that he is diabetic and has wrong test strips for his glucometer so he hasn't been able to check glucose. RN checked CBG and VS. RN advised client to call office on Friday to get new prescription for strips made for his machine so he can begin checking his blood sugar as needed.

## 2023-12-24 ENCOUNTER — Telehealth: Payer: Self-pay | Admitting: Pharmacist

## 2023-12-24 ENCOUNTER — Ambulatory Visit: Payer: MEDICAID | Admitting: Pharmacist

## 2023-12-24 NOTE — Telephone Encounter (Signed)
Called patient to reschedule an appointment for DM management. I was unable to reach the patient so I left a HIPAA-compliant message requesting that the patient return my call.   Butch Penny, PharmD, Patsy [place], CPP Clinical Pharmacist [facility] [phone]

## 2023-12-30 NOTE — Progress Notes (Deleted)
 S:     No chief complaint on file.  43 y.o. male with PMHx significant for T2DM, HLD, PTSD, and history of tobacco use who presents for diabetes evaluation, education, and management.     Patient was referred and last seen by Primary Care Provider, Dr. Jonah Blue, on 11/20/2023. At that visit A1c was found to be increased to 13.8 from 11 in July 2024 (last time A1c was at goal was in December 2023). Patient reported poor medication adherence to Dr. Laural Benes over 3-4 months. He admitted to dietary indiscretion and reduced physical activity. He also reported polyuria.   He was last seen by pharmacy clinic in May 2024 for diabetes.    *** Patient was referred by *** on ***. Patient was last seen by Primary Care Provider, Dr. ***, on ***.      Patient arrives in *** good spirits and presents without *** any assistance. ***Patient is accompanied by ***.   At last visit, ***.    Patient reports Diabetes was diagnosed in 2023.    Family/Social History: -Family History: -[place] Use: -Alcohol Use:   Current diabetes medications include:  -Lantus 20 units daily -Humalog 5 units with meals TID  -Metformin 500 mg BID    Current hypertension medications include:  -Valsartan 40 mg daily (per last PCP note but no active prescription)    Current hyperlipidemia medications include:  -Rosuvastatin 10 mg daily (per last PCP note but no active prescription)   Patient reports adherence to taking all medications as prescribed.  *** Patient denies adherence with medications, reports missing *** medications *** times per week, on average.  Do you feel that your medications are working for you? {YES NO:22349} Have you been experiencing any side effects to the medications prescribed? {YES NO:22349} Do you have any problems obtaining medications due to transportation or finances? {YES [id number] Insurance coverage: ***  Patient {Actions; denies-reports:120008} hypoglycemic  events.  Reported home fasting blood sugars: ***  Reported 2 hour post-meal/random blood sugars: ***.  Patient {Actions; denies-reports:120008} nocturia (nighttime urination).  Patient {Actions; denies-reports:120008} neuropathy (nerve pain). Patient {Actions; denies-reports:120008} visual changes. Patient {Actions; denies-reports:120008} self foot exams.   Patient reported dietary habits: Eats *** meals/day Breakfast: *** Lunch: *** Dinner: *** Snacks: *** Drinks: ***  Within the past 12 months, did you worry whether your food would run out before you got money to buy more? {YES NO:22349} Within the past 12 months, did the food you bought run out, and you didn't have money to get more? {YES NO:22349} PHQ-9 Score: ***  Patient-reported exercise habits: ***   O:   ROS  Physical Exam  7 day average blood glucose: ***  Libre3 CGM Download today *** % Time CGM is active: ***% Average Glucose: *** mg/dL Glucose Management Indicator: ***  Glucose Variability: ***% (goal <36%) Time in Goal:  - Time in range 70-180: ***% - Time above range: ***% - Time below range: ***% Observed patterns:   Lab Results  Component Value Date   HGBA1C 13.8 (A) 11/20/2023   There were no vitals filed for this visit.  Lipid Panel     Component Value Date/Time   CHOL 233 (H) 11/20/2023 1204   TRIG 989 (HH) 11/20/2023 1204   HDL 29 (L) 11/20/2023 1204   CHOLHDL 8.0 (H) 11/20/2023 1204   CHOLHDL 6.6 07/06/2022 0610   VLDL 66 (H) 07/06/2022 0610   LDLCALC Comment (A) 11/20/2023 1204    Clinical Atherosclerotic Cardiovascular Disease (ASCVD): {YES/NO:21197} The  10-year ASCVD risk score (Arnett DK, et al., 2019) is: 9.4%   Values used to calculate the score:     Age: 27 years     Sex: Male     Is Non-Hispanic African American: Yes     Diabetic: Yes     Tobacco smoker: No     Systolic Blood Pressure: 136 mmHg     Is BP treated: No     HDL Cholesterol: 29 mg/dL     Total  Cholesterol: 233 mg/dL   Patient is participating in a Managed Medicaid Plan:  Yes   A/P: Diabetes longstanding *** currently ***. Patient is *** able to verbalize appropriate hypoglycemia management plan. Medication adherence appears ***. Control is suboptimal due to ***. -{Meds adjust:18428} basal insulin *** Lantus/Basaglar/Semglee (insulin glargine) *** Tresiba (insulin degludec) from *** units to *** units daily in the morning. Patient will continue to titrate 1 unit every *** days if fasting blood sugar > 100mg /dl until fasting blood sugars reach goal or next visit.  -{Meds adjust:18428} rapid insulin *** Novolog (insulin aspart) *** Humalog (insulin lispro) from *** to ***.  -{Meds adjust:18428} GLP-1 *** Trulicity (dulaglutide) *** Ozempic (semaglutide) *** Mounjaro (tirzepatide) from *** mg to *** mg .  -{Meds adjust:18428} SGLT2-I *** Farxiga (dapagliflozin) *** Jardiance (empagliflozin) 10 mg. Counseled on sick day rules. -{Meds adjust:18428} metformin ***.  -Patient educated on purpose, proper use, and potential adverse effects of ***.  -Extensively discussed pathophysiology of diabetes, recommended lifestyle interventions, dietary effects on blood sugar control.  -Counseled on s/sx of and management of hypoglycemia.  -Next A1c anticipated ***.   ASCVD risk - primary ***secondary prevention in patient with diabetes. Last LDL is *** not at goal of <95 *** mg/dL. ASCVD risk factors include *** and 10-year ASCVD risk score of ***. {Desc; low/moderate/high:110033} intensity statin indicated.  -{Meds adjust:18428} ***statin *** mg.   Hypertension longstanding *** currently ***. Blood pressure goal of <130/80 *** mmHg. Medication adherence ***. Blood pressure control is suboptimal due to ***. -{Meds adjust:18428} *** mg.  Written patient instructions provided. Patient verbalized understanding of treatment plan.  Total time in face to face counseling *** minutes.    Follow-up:   Pharmacist *** PCP clinic visit in *** Patient seen with ***

## 2023-12-31 ENCOUNTER — Ambulatory Visit: Payer: MEDICAID | Admitting: Pharmacist

## 2024-01-06 ENCOUNTER — Other Ambulatory Visit: Payer: Self-pay | Admitting: Pharmacist

## 2024-01-06 DIAGNOSIS — E1165 Type 2 diabetes mellitus with hyperglycemia: Secondary | ICD-10-CM

## 2024-01-06 LAB — GLUCOSE, POCT (MANUAL RESULT ENTRY): POC Glucose: 153 mg/dL — AB (ref 70–99)

## 2024-01-06 MED ORDER — ACCU-CHEK SOFTCLIX LANCETS MISC: 6 refills | Status: DC

## 2024-01-06 MED ORDER — ACCU-CHEK GUIDE TEST VI STRP: ORAL_STRIP | 6 refills | Status: DC

## 2024-01-06 MED ORDER — ACCU-CHEK GUIDE W/DEVICE KIT: PACK | 0 refills | Status: DC

## 2024-01-06 NOTE — Congregational Nurse Program (Signed)
 Client to RN office requesting CBG and BP check. States he is on a wellness journey, eating healthy, and changing his lifestyle. He is walking 3 miles a day, eating raw vegetables and whole foods, with some restrictive dietary choices such as 1500 cal per day, and frequent fasting which he is starting to experience crashes periodically. He is not taking any of his medicine in attempt to try to fix hypertension and DM with nutrition and exercise. RN educated him that the lifestyle change is a great thing and to continue doing it but he should to do it in contingency with pharmacological interventions prescribed by MD. He also has no way of monitoring BP or CBG daily. RN will provide him with a BP machine, and log book so he can check his pressure daily. RN also [employer] [facility] From PCP office to help him get strips for his glucometer to be appropriately check his CBG daily.  RN counseled him on his most recent labs including his Lipid profile (Triglycerides- 989) and A1C (13.8). That it is not advantageous of him to not take his medicine at this time. RN encouraged client to take medicine in conjunction with healthy lifestyle, especially without being able to monitor CBG and BP regularly. RN thoroughly educated him on the pathophysiology of diabetes and that it is not a simple fix for blood glucose / blood pressure to significantly change within one month. He is feeling better and having more energy but  understands and is agreeable to taking prescribed medicine.

## 2024-01-11 NOTE — Congregational Nurse Program (Signed)
 Client followed with RN for at home BP monitoring system provided by CNP. He also states he was able to pick up his test strips for his glucometer so he can now test his CBG appropriately. He has no other needs at this time.

## 2024-01-12 LAB — AMB RESULTS CONSOLE CBG: Glucose: 160

## 2024-01-12 NOTE — Progress Notes (Signed)
 Pt was screened for HTN and non fasting blood sugar. He was concerned about his BP being too high. He brought his BP machine and cuff, the cuff he was using was not the appropriate size for his arm. Educated on proper cuff size and proper placement of cuff when taking a BP at home, stated understanding. His blood sugar was elevated. His last A1C was in Feb at his PCP office. Educational material given on life style and diet changes for DM. All questions answered, pt stated understanding. Recommendations given to speak to PCP about medication knowledge

## 2024-01-13 ENCOUNTER — Other Ambulatory Visit: Payer: Self-pay

## 2024-01-13 ENCOUNTER — Encounter: Payer: Self-pay | Admitting: Physician Assistant

## 2024-01-13 ENCOUNTER — Ambulatory Visit: Payer: MEDICAID | Attending: Physician Assistant | Admitting: Physician Assistant

## 2024-01-13 VITALS — BP 138/82 | HR 78 | Temp 98.7°F | Resp 20 | Ht 71.0 in | Wt 257.0 lb

## 2024-01-13 DIAGNOSIS — Z91199 Patient's noncompliance with other medical treatment and regimen due to unspecified reason: Secondary | ICD-10-CM | POA: Diagnosis not present

## 2024-01-13 DIAGNOSIS — Z794 Long term (current) use of insulin: Secondary | ICD-10-CM | POA: Diagnosis not present

## 2024-01-13 DIAGNOSIS — E871 Hypo-osmolality and hyponatremia: Secondary | ICD-10-CM | POA: Diagnosis not present

## 2024-01-13 DIAGNOSIS — Z789 Other specified health status: Secondary | ICD-10-CM | POA: Diagnosis not present

## 2024-01-13 DIAGNOSIS — E1165 Type 2 diabetes mellitus with hyperglycemia: Secondary | ICD-10-CM | POA: Diagnosis not present

## 2024-01-13 LAB — GLUCOSE, POCT (MANUAL RESULT ENTRY): POC Glucose: 125 mg/dL — AB (ref 70–99)

## 2024-01-13 LAB — POCT GLYCOSYLATED HEMOGLOBIN (HGB A1C): HbA1c, POC (controlled diabetic range): 9.8 % — AB (ref 0.0–7.0)

## 2024-01-13 MED ORDER — ACCU-CHEK GUIDE TEST VI STRP
ORAL_STRIP | 6 refills | Status: DC
  Filled 2024-01-13: qty 100, 30d supply, fill #0

## 2024-01-13 MED ORDER — METFORMIN HCL 500 MG PO TABS
500.0000 mg | ORAL_TABLET | Freq: Two times a day (BID) | ORAL | 1 refills | Status: DC
  Filled 2024-01-13: qty 180, 90d supply, fill #0

## 2024-01-13 MED ORDER — ACCU-CHEK GUIDE W/DEVICE KIT
PACK | 0 refills | Status: DC
  Filled 2024-01-13: qty 1, 30d supply, fill #0

## 2024-01-13 MED ORDER — ACCU-CHEK SOFTCLIX LANCETS MISC
6 refills | Status: DC
  Filled 2024-01-13: qty 100, 30d supply, fill #0

## 2024-01-13 NOTE — Progress Notes (Signed)
 Here to address concerns for health Cholesterol and A1C  Patient is not taking any of the medication prescribed

## 2024-01-13 NOTE — Patient Instructions (Addendum)
 Start metformin twice daily  Drink 64 to 80 ounces water daily  Continue exercise and dietary changes.   Check blood sugars fasting and at bedtime and record and bring to next visit

## 2024-01-13 NOTE — Progress Notes (Signed)
 Patient ID: Jeffery Santos, male   DOB: 10/07/1981, 43 y.o.   MRN: [id number]   [name], is a 43 y.o. male  [id number]  [id number]  DOB - 1981-04-27  No chief complaint on file.      Subjective:   Jeffery Santos is a 43 y.o. male here today for a follow up visit to see if his aggressive lifestyle changes are making changes in his health.  He really wants to have his A1C rechecked today even tho it has only been about 6 weeks since it was checked.  He says he has been unable to get a glucometer bc [facility] did not have one when he went to pick it up.  He says he has never really taken his diabetes seriously until he saw a documentary about it being the silent killer.  After he saw the documentary, he cut out simple sugars, cut back on carbs and has been eating more lean meats, vegetables and fruit.  He is walking 3 to 4 miles daily.  He did not take any insulin or oral tabs.  Currently not taking anything and does not want to.  He says he is committed to the lifestyle changes and trying to reverse the diabetes.  No complaints today.    No problems updated.  ALLERGIES: Allergies  Allergen Reactions   Hydrocodone-Acetaminophen Nausea And Vomiting    PAST MEDICAL HISTORY: Past Medical History:  Diagnosis Date   Acid reflux    Diabetes mellitus without complication (HCC)     MEDICATIONS AT HOME: Prior to Admission medications   Medication Sig Start Date End Date Taking? Authorizing Provider  Accu-Chek Softclix Lancets lancets Use to check blood sugar 3 times daily. E11.65 01/13/24   Anders Simmonds, PA-C  Blood Glucose Monitoring Suppl (ACCU-CHEK GUIDE) w/Device KIT Use to check blood sugar 3 times daily. E11.65 01/13/24   Anders Simmonds, PA-C  glucose blood (ACCU-CHEK GUIDE TEST) test strip Use to check blood sugar 3 times daily. E11.65 01/13/24   Anders Simmonds, PA-C  ketoconazole (NIZORAL) 2 % cream Apply 1 Application topically 2 (two) times daily. Patient not taking: Reported  on 01/13/2024 12/20/23   Garrison, [place] N, FNP  metFORMIN (GLUCOPHAGE) 500 MG tablet Take 1 tablet (500 mg total) by mouth 2 (two) times daily with a meal. 01/13/24   Hayslee Casebolt, Marzella Schlein, PA-C  ARIPiprazole (ABILIFY) 10 MG tablet Take by mouth.  11/17/20  [provider]  calcium carbonate (OS-CAL) 1250 (500 Ca) MG chewable tablet Chew by mouth. 05/21/17 01/28/20  [provider]  cloNIDine (CATAPRES) 0.1 MG tablet Take by mouth.  01/28/20  [provider]  FLUoxetine (PROZAC) 20 MG capsule Take by mouth.  01/28/20  [provider]  gabapentin (NEURONTIN) 100 MG capsule Take by mouth. 05/21/17 12/12/19  [provider]  promethazine (PHENERGAN) 12.5 MG tablet Take by mouth. 05/21/17 12/12/19  [provider]    ROS: Neg HEENT Neg resp Neg cardiac Neg GI Neg GU Neg MS Neg psych Neg neuro  Objective:   Vitals:   01/13/24 1548  BP: 138/82  Pulse: 78  Resp: 20  Temp: 98.7 F (37.1 C)  TempSrc: Oral  SpO2: 99%  Weight: 257 lb (116.6 kg)  Height: 5\' 11"  (1.803 m)   Exam General appearance : Awake, alert, not in any distress. Speech Clear. Not toxic looking HEENT: Atraumatic and Normocephalic reactive to light and accomodation Neck: Supple, no JVD. No cervical lymphadenopathy.  Chest: Good air  entry bilaterally, CTAB.  No rales/rhonchi/wheezing CVS: S1 S2 regular, no murmurs.  Extremities: B/L Lower Ext shows no edema, both legs are warm to touch Neurology: Awake alert, and oriented X 3, CN II-XII intact, Non focal Skin: No Rash  Data Review Lab Results  Component Value Date   HGBA1C 9.8 (A) 01/13/2024   HGBA1C 13.8 (A) 11/20/2023   HGBA1C 11.8 (A) 06/01/2023    Assessment & Plan   1. Type 2 diabetes mellitus with hyperglycemia, with long-term current use of insulin (HCC) (Primary) He is not fasting today.   Improving-he was not due for A1c today but really wanted it checked.  He has agreed to at least start metformin while  continuing - Glucose (CBG) - HgB A1c - Accu-Chek Softclix Lancets lancets; Use to check blood sugar 3 times daily. E11.65  Dispense: 100 each; Refill: 6 - glucose blood (ACCU-CHEK GUIDE TEST) test strip; Use to check blood sugar 3 times daily. E11.65  Dispense: 100 each; Refill: 6 - Blood Glucose Monitoring Suppl (ACCU-CHEK GUIDE) w/Device KIT; Use to check blood sugar 3 times daily. E11.65  Dispense: 1 kit; Refill: 0 - metFORMIN (GLUCOPHAGE) 500 MG tablet; Take 1 tablet (500 mg total) by mouth 2 (two) times daily with a meal.  Dispense: 180 tablet; Refill: 1 - Basic Metabolic Panel Drink 64 to 80 ounces water daily  Continue exercise and dietary changes.   Check blood sugars fasting and at bedtime and record and bring to next visit   2. Hyponatremia - Basic Metabolic Panel  3. Noncompliance Patient stopped all meds/never took most of them.  I have encouraged him to work along side him as he makes these changes and we can monitor progress and titrate/stop/start meds as indicated.    4. History of recent change in lifestyle Keep up the great work on lifestyle changes!!!    Return for keep scheduled appts. Franky Macho in 2 weeks)  The patient was given clear instructions to go to ER or return to medical center if symptoms don't improve, worsen or new problems develop. The patient verbalized understanding. The patient was told to call to get lab results if they haven't heard anything in the next week.      Georgian Co, PA-C [facility] [place], [place] [phone]   01/13/2024, 5:28 PM

## 2024-01-14 LAB — BASIC METABOLIC PANEL
BUN/Creatinine Ratio: 7 — ABNORMAL LOW (ref 9–20)
BUN: 8 mg/dL (ref 6–24)
CO2: 25 mmol/L (ref 20–29)
Calcium: 9.6 mg/dL (ref 8.7–10.2)
Chloride: 100 mmol/L (ref 96–106)
Creatinine, Ser: 1.08 mg/dL (ref 0.76–1.27)
Glucose: 101 mg/dL — ABNORMAL HIGH (ref 70–99)
Potassium: 4.6 mmol/L (ref 3.5–5.2)
Sodium: 140 mmol/L (ref 134–144)
eGFR: 87 mL/min/{1.73_m2} (ref >59)

## 2024-01-18 NOTE — Congregational Nurse Program (Signed)
 Client came to [facility] to visit RN to follow up with RN to discuss his recent appointment and that his A1C is now 9.8 from about 13 previously. The diet and exercise plan he is using is working in conjunction with RN to educate and he is thankful Teacher, adult education for supporting his needs and assisting him. He is so proud of himself.

## 2024-01-19 ENCOUNTER — Telehealth: Payer: Self-pay

## 2024-01-19 NOTE — Telephone Encounter (Signed)
 Is he wanting to donate plasma?  If so, they usually would send [place] a form or have him give [place] a form to document and sign.

## 2024-01-19 NOTE — Telephone Encounter (Signed)
 Patient stated that he needed a letter saying its ok for him to donate blood since his lab work is now in normal range

## 2024-01-20 NOTE — Telephone Encounter (Signed)
 Called patient, line disconnected in the middle of talking with patient. I called patient back unable to make contact or leave voicemail due mailbox being full

## 2024-01-20 NOTE — Telephone Encounter (Signed)
 Patient returning call to [facility]. Requesting call back, [phone]

## 2024-01-21 NOTE — Telephone Encounter (Signed)
 Called patient and he is aware of note, Patient stated that he will drop the paperwork off sometime today

## 2024-01-26 NOTE — Telephone Encounter (Deleted)
 FYI!  The patient came to the office to pick up the letter today, The patient stepped outside the office, I called the patient to inform that the letter was ready, but the patient did not answer.

## 2024-01-27 ENCOUNTER — Telehealth: Payer: Self-pay | Admitting: Internal Medicine

## 2024-01-27 NOTE — Telephone Encounter (Signed)
 Patient identified by name and date of birth.  Patient aware of information needed and will reach out to the office that did the labs to obtain the results.

## 2024-01-27 NOTE — Telephone Encounter (Signed)
 Let patient know that I received the information that he dropped off from [employer] requesting a letter of approval to allow him to donate plasma once he is evaluated for an abnormal blood test.  They mention specifically that he had an abnormal blood test called a serum electrophoresis.  Did they do blood tests on him?  If so can he get a copy of the results for me?  I will need to see what they are referring to before I can sign off on a letter.

## 2024-01-29 ENCOUNTER — Ambulatory Visit: Payer: MEDICAID | Admitting: Pharmacist

## 2024-02-01 ENCOUNTER — Other Ambulatory Visit: Payer: Self-pay

## 2024-02-01 ENCOUNTER — Ambulatory Visit (HOSPITAL_COMMUNITY)
Admission: EM | Admit: 2024-02-01 | Discharge: 2024-02-01 | Disposition: A | Discharge status: Home / Self Care | Payer: MEDICAID | Attending: Physician Assistant | Admitting: Physician Assistant

## 2024-02-01 ENCOUNTER — Encounter (HOSPITAL_COMMUNITY): Payer: Self-pay | Admitting: Emergency Medicine

## 2024-02-01 DIAGNOSIS — J302 Other seasonal allergic rhinitis: Secondary | ICD-10-CM | POA: Diagnosis not present

## 2024-02-01 DIAGNOSIS — J0100 Acute maxillary sinusitis, unspecified: Secondary | ICD-10-CM

## 2024-02-01 MED ORDER — AMOXICILLIN-POT CLAVULANATE 875-125 MG PO TABS: 1.0000 | ORAL_TABLET | Freq: Two times a day (BID) | ORAL | 0 refills | Status: DC

## 2024-02-01 NOTE — ED Provider Notes (Signed)
 [facility]    CSN: [id number] Arrival date & time: 02/01/24  1815      History   Chief Complaint No chief complaint on file.   HPI Jeffery Santos is a 43 y.o. male.   HPI  He reports he has persistent cough that is causing chest pain  He states he has developed headaches, postnasal drainage, nasal congestion, rhinorrhea He reports this has been ongoing for about a week He reports sore throat as well  He denies fever, chills,   Interventions: Mucinex seemed to help but symptoms returned after about 4 days of treatment   Past Medical History:  Diagnosis Date   Acid reflux    Diabetes mellitus without complication [facility])     Patient Active Problem List   Diagnosis Date Noted   Type 2 diabetes mellitus with obesity (HCC) 06/01/2023   Major depressive disorder, single episode, mild (HCC) 10/06/2022   Hyperglycemia due to diabetes mellitus (HCC) 07/05/2022   PTSD (post-traumatic stress disorder) 07/05/2022    Past Surgical History:  Procedure Laterality Date   ANKLE ARTHROSCOPY WITH OPEN REDUCTION INTERNAL FIXATION (ORIF)     HERNIA REPAIR Left 2001   WOUND EXPLORATION Left 05/01/2021   Procedure: WOUND EXPLORATION-Left small finger wound exploration and tendon repair;  Surgeon: Bradly Bienenstock, MD;  Location: [facility];  Service: Orthopedics;  Laterality: Left;       Home Medications    Prior to Admission medications   Medication Sig Start Date End Date Taking? Authorizing Provider  amoxicillin-clavulanate (AUGMENTIN) 875-125 MG tablet Take 1 tablet by mouth every 12 (twelve) hours. 02/01/24  Yes Latisa Belay E, PA-C  Accu-Chek Softclix Lancets lancets Use to check blood sugar 3 times daily. E11.65 01/13/24   Anders Simmonds, PA-C  Blood Glucose Monitoring Suppl (ACCU-CHEK GUIDE) w/Device KIT Use to check blood sugar 3 times daily. E11.65 01/13/24   Anders Simmonds, PA-C  glucose blood (ACCU-CHEK GUIDE TEST) test strip Use to check blood sugar 3 times daily.  E11.65 01/13/24   Anders Simmonds, PA-C  ketoconazole (NIZORAL) 2 % cream Apply 1 Application topically 2 (two) times daily. Patient not taking: Reported on 01/13/2024 12/20/23   Garrison, [place] N, FNP  metFORMIN (GLUCOPHAGE) 500 MG tablet Take 1 tablet (500 mg total) by mouth 2 (two) times daily with a meal. 01/13/24   McClung, Marzella Schlein, PA-C  ARIPiprazole (ABILIFY) 10 MG tablet Take by mouth.  11/17/20  [provider]  calcium carbonate (OS-CAL) 1250 (500 Ca) MG chewable tablet Chew by mouth. 05/21/17 01/28/20  [provider]  cloNIDine (CATAPRES) 0.1 MG tablet Take by mouth.  01/28/20  [provider]  FLUoxetine (PROZAC) 20 MG capsule Take by mouth.  01/28/20  [provider]  gabapentin (NEURONTIN) 100 MG capsule Take by mouth. 05/21/17 12/12/19  [provider]  promethazine (PHENERGAN) 12.5 MG tablet Take by mouth. 05/21/17 12/12/19  [provider]    Family History Family History  Problem Relation Age of Onset   Healthy Mother    Healthy Father     Social History Social History   Tobacco Use   Smoking status: Former    Current packs/day: 0.00    Average packs/day: 0.5 packs/day for 13.0 years (6.5 ttl pk-yrs)    Types: Cigarettes    Start date: 10/2008    Quit date: 10/2021    Years since quitting: 2.3   Smokeless tobacco: Never  Vaping Use   Vaping status: Never Used  Substance Use Topics   Alcohol use: Yes    Comment: occasionally   Drug use: No     Allergies   Hydrocodone-acetaminophen   Review of Systems Review of Systems  Constitutional:  Positive for fatigue. Negative for chills and fever.  HENT:  Positive for congestion, postnasal drip, rhinorrhea and sore throat.   Respiratory:  Positive for cough and chest tightness.   Neurological:  Positive for headaches.     Physical Exam Triage Vital Signs ED Triage Vitals  Encounter Vitals Group     BP 02/01/24 1903 137/82     Systolic BP Percentile --       Diastolic BP Percentile --      Pulse Rate 02/01/24 1903 88     Resp 02/01/24 1903 20     Temp 02/01/24 1903 98.2 F (36.8 C)     Temp Source 02/01/24 1903 Oral     SpO2 02/01/24 1903 95 %     Weight --      Height --      Head Circumference --      Peak Flow --      Pain Score 02/01/24 1900 0     Pain Loc --      Pain Education --      Exclude from Growth Chart --    No data found.  Updated Vital Signs BP 137/82 (BP Location: Left Arm) Comment (BP Location): large cuff  Pulse 88   Temp 98.2 F (36.8 C) (Oral)   Resp 20   SpO2 95%   Visual Acuity Right Eye Distance:   Left Eye Distance:   Bilateral Distance:    Right Eye Near:   Left Eye Near:    Bilateral Near:     Physical Exam Vitals reviewed.  Constitutional:      General: He is awake.     Appearance: Normal appearance. He is well-developed and well-groomed.  HENT:     Head: Normocephalic and atraumatic.     Right Ear: Hearing, tympanic membrane and ear canal normal.     Left Ear: Hearing, tympanic membrane and ear canal normal.     Mouth/Throat:     Lips: Pink.     Mouth: Mucous membranes are moist.     Pharynx: Oropharynx is clear. Uvula midline. No pharyngeal swelling, oropharyngeal exudate, posterior oropharyngeal erythema, uvula swelling or postnasal drip.     Tonsils: No tonsillar exudate or tonsillar abscesses.  Eyes:     General: Lids are normal. Gaze aligned appropriately.     Extraocular Movements: Extraocular movements intact.  Cardiovascular:     Rate and Rhythm: Normal rate and regular rhythm.     Heart sounds: Normal heart sounds.  Pulmonary:     Effort: Pulmonary effort is normal.     Breath sounds: Normal breath sounds. No decreased air movement. No decreased breath sounds, wheezing, rhonchi or rales.  Musculoskeletal:     Cervical back: Normal range of motion and neck supple.  Lymphadenopathy:     Head:     Right side of head: No submental, submandibular or preauricular adenopathy.      Left side of head: No submental, submandibular or preauricular adenopathy.     Cervical:     Right cervical: No superficial cervical adenopathy.    Left cervical: No superficial cervical adenopathy.     Upper Body:     Right upper body: No supraclavicular adenopathy.     Left upper body: No supraclavicular adenopathy.  Skin:  General: Skin is warm and dry.  Neurological:     General: No focal deficit present.     Mental Status: He is alert and oriented to person, place, and time.  Psychiatric:        Mood and Affect: Mood normal.        Behavior: Behavior normal. Behavior is cooperative.        Thought Content: Thought content normal.        Judgment: Judgment normal.      UC Treatments / Results  Labs (all labs ordered are listed, but only abnormal results are displayed) Labs Reviewed - No data to display  EKG   Radiology No results found.  Procedures Procedures (including critical care time)  Medications Ordered in UC Medications - No data to display  Initial Impression / Assessment and Plan / UC Course  I have reviewed the triage vital signs and the nursing notes.  Pertinent labs & imaging results that were available during my care of the patient were reviewed by me and considered in my medical decision making (see chart for details).      Final Clinical Impressions(s) / UC Diagnoses   Final diagnoses:  Acute maxillary sinusitis, recurrence not specified  Seasonal allergic rhinitis, unspecified trigger   Patient presents today with concerns for sinus congestion, rhinorrhea, chest soreness, headache, dry coughing.  He reports that he did try Mucinex for several days but this did not provide lasting relief.  He reports his symptoms have been ongoing for almost 13 days.  At this time given chronicity of symptoms as well as HPI and symptom constellation I suspect likely a bacterial sinusitis.  Will start Augmentin p.o. twice daily x 7 days.  Recommend  over-the-counter medications as needed for further symptomatic relief.  Patient reports that he typically develops similar symptoms about twice per year usually in the spring and fall.  Reviewed that this is probably secondary to seasonal allergies providing adequate environment for pulm secondary bacterial infection.  Reviewed with patient that he should start antihistamine and Flonase regimen several weeks prior to high pollen seasons in [place].  ED and return precautions reviewed and provided in after visit summary.  Follow-up as needed for progressing or persistent symptoms     Discharge Instructions      Based on your symptoms and duration of illness, I believe you may have a bacterial sinus infection  These typically resolve with antibiotic therapy along with at-home comfort measures  Today I have sent in a prescription for  Augmentin 875-125 mg to be taken by mouth twice per day for 7 days FINISH THE ENTIRE COURSE unless you develop an allergic reaction or are instructed to discontinue.  It can take a few days for the antibiotic to kick in so I recommend symptomatic relief with over the counter medication such as the following: Dayquil/ Nyquil Theraflu Alkaseltzer   If you have high blood pressure I recommend that you take Mucinex, Robitussin, Tylenol instead of the combination medications.  Combination medications typically have a decongestant in them that can cause your blood pressure to be high.  I also recommend adding an antihistamine to your daily regimen especially before and during  the high pollen seasons in  (spring and fall)  This includes medications like Claritin, Allegra, Zyrtec- the generics of these work very well and are usually less expensive I recommend using Flonase nasal spray - 2 puffs twice per day to help with your nasal congestion The antihistamines and Flonase  can take a few weeks to provide significant relief from allergy symptoms but should start to provide  some benefit soon.    These medications typically have Tylenol in them already so you do not need to supplement with more outside of those medications  Stay well hydrated with at least 75 oz of water per day to help with recovery  If at any point you start to develop swelling around the eyes and nose, trouble seeing, fevers that are not responding to medications, trouble breathing, passing out or headaches that are very severe please go to the emergency room for further evaluation management      ED Prescriptions     Medication Sig Dispense Auth. Provider   amoxicillin-clavulanate (AUGMENTIN) 875-125 MG tablet Take 1 tablet by mouth every 12 (twelve) hours. 14 tablet Trysten Berti E, PA-C      PDMP not reviewed this encounter.   Roselind Messier 02/01/24 [id number]

## 2024-02-01 NOTE — Discharge Instructions (Addendum)
 Based on your symptoms and duration of illness, I believe you may have a bacterial sinus infection  These typically resolve with antibiotic therapy along with at-home comfort measures  Today I have sent in a prescription for  Augmentin 875-125 mg to be taken by mouth twice per day for 7 days FINISH THE ENTIRE COURSE unless you develop an allergic reaction or are instructed to discontinue.  It can take a few days for the antibiotic to kick in so I recommend symptomatic relief with over the counter medication such as the following: Dayquil/ Nyquil Theraflu Alkaseltzer   If you have high blood pressure I recommend that you take Mucinex, Robitussin, Tylenol instead of the combination medications.  Combination medications typically have a decongestant in them that can cause your blood pressure to be high.  I also recommend adding an antihistamine to your daily regimen especially before and during  the high pollen seasons in [place] (spring and fall)  This includes medications like Claritin, Allegra, Zyrtec- the generics of these work very well and are usually less expensive I recommend using Flonase nasal spray - 2 puffs twice per day to help with your nasal congestion The antihistamines and Flonase can take a few weeks to provide significant relief from allergy symptoms but should start to provide some benefit soon.    These medications typically have Tylenol in them already so you do not need to supplement with more outside of those medications  Stay well hydrated with at least 75 oz of water per day to help with recovery  If at any point you start to develop swelling around the eyes and nose, trouble seeing, fevers that are not responding to medications, trouble breathing, passing out or headaches that are very severe please go to the emergency room for further evaluation management

## 2024-02-01 NOTE — ED Triage Notes (Signed)
 Patient is concerned for a sinus infection.  Stuffiness worse at night.  Symptoms for 1 1/2 weeks, chest soreness, headache.  Complains of chest congestion  Has tried mucinex

## 2024-02-03 NOTE — Telephone Encounter (Signed)
 Patient came in to drop off a copy of the blood test results (serum electrophoresis), advised pt that PCP returns on the 7th of April and to call if he is inquiring the status of his letter.

## 2024-02-03 NOTE — Telephone Encounter (Signed)
 Results received and placed in provider box. Please inform patient that letter is ready per previous telephone encounter. Please locate form before calling.

## 2024-02-04 ENCOUNTER — Telehealth: Payer: Self-pay

## 2024-02-04 NOTE — Telephone Encounter (Signed)
 Patient in the office for a list of all current and discontined Medications. Current list given to patient as requested. Advised patient the we are unable to print out discontinued medication. Patient does not have a MyChart account and is not interested in one at this time

## 2024-02-05 ENCOUNTER — Ambulatory Visit (HOSPITAL_COMMUNITY)
Admission: EM | Admit: 2024-02-05 | Discharge: 2024-02-05 | Disposition: A | Discharge status: Home / Self Care | Payer: MEDICAID | Attending: Emergency Medicine | Admitting: Emergency Medicine

## 2024-02-05 ENCOUNTER — Encounter (HOSPITAL_COMMUNITY): Payer: Self-pay

## 2024-02-05 DIAGNOSIS — R051 Acute cough: Secondary | ICD-10-CM | POA: Diagnosis not present

## 2024-02-05 DIAGNOSIS — J069 Acute upper respiratory infection, unspecified: Secondary | ICD-10-CM | POA: Diagnosis not present

## 2024-02-05 MED ORDER — BENZONATATE 100 MG PO CAPS: 100.0000 mg | ORAL_CAPSULE | Freq: Three times a day (TID) | ORAL | 0 refills | Status: DC

## 2024-02-05 MED ORDER — PROMETHAZINE-DM 6.25-15 MG/5ML PO SYRP
5.0000 mL | ORAL_SOLUTION | Freq: Four times a day (QID) | ORAL | 0 refills | Status: DC | PRN
Start: 2024-02-05 — End: 2024-09-12

## 2024-02-05 MED ORDER — CEFDINIR 300 MG PO CAPS: 300.0000 mg | ORAL_CAPSULE | Freq: Two times a day (BID) | ORAL | 0 refills | Status: AC

## 2024-02-05 NOTE — Discharge Instructions (Signed)
 Discontinue the Augmentin as it is not working for you.  Start the [place] and take this twice daily with food for the next 10 days.  You can use the [employer] for your cough throughout the day.  At night please use the Promethazine DM.  Do not drink or drive on this medication as may cause sedation or drowsiness.  Sleeping in the middle fire cannot moisten secretions and loosen it as well.  Patient is a 70 movement over the next 3 days, if no improvement or any changes please follow-up with your primary care provider for reevaluation.

## 2024-02-05 NOTE — ED Provider Notes (Signed)
 [facility]    CSN: [id number] Arrival date & time: 02/05/24  1212      History   Chief Complaint Chief Complaint  Patient presents with   Nasal Congestion    Nasal congestion, coughing, and chest congestion    HPI Jeffery Santos is a 43 y.o. male.   Patient presents to clinic over concerns of ongoing nasal congestion, chest congestion, cough and rhinorrhea.  This has been present for 1.5 weeks.  He was seen at this clinic on Monday and started on Augmentin, has been taking this as prescribed but reports he has not had any difference in his symptoms.  Denies wheezing or shortness of breath.  Has felt hot and cold chills.  The history is provided by the patient and medical records.    Past Medical History:  Diagnosis Date   Acid reflux    Diabetes mellitus without complication [facility])     Patient Active Problem List   Diagnosis Date Noted   Type 2 diabetes mellitus with obesity (HCC) 06/01/2023   Major depressive disorder, single episode, mild (HCC) 10/06/2022   Hyperglycemia due to diabetes mellitus (HCC) 07/05/2022   PTSD (post-traumatic stress disorder) 07/05/2022    Past Surgical History:  Procedure Laterality Date   ANKLE ARTHROSCOPY WITH OPEN REDUCTION INTERNAL FIXATION (ORIF)     HERNIA REPAIR Left 2001   WOUND EXPLORATION Left 05/01/2021   Procedure: WOUND EXPLORATION-Left small finger wound exploration and tendon repair;  Surgeon: Bradly Bienenstock, MD;  Location: [facility];  Service: Orthopedics;  Laterality: Left;       Home Medications    Prior to Admission medications   Medication Sig Start Date End Date Taking? Authorizing Provider  Accu-Chek Softclix Lancets lancets Use to check blood sugar 3 times daily. E11.65 01/13/24  Yes Anders Simmonds, PA-C  amoxicillin-clavulanate (AUGMENTIN) 875-125 MG tablet Take 1 tablet by mouth every 12 (twelve) hours. 02/01/24  Yes Mecum, Erin E, PA-C  benzonatate (TESSALON) 100 MG capsule Take 1 capsule (100 mg total)  by mouth every 8 (eight) hours. 02/05/24  Yes Rinaldo Ratel, [place] N, FNP  Blood Glucose Monitoring Suppl (ACCU-CHEK GUIDE) w/Device KIT Use to check blood sugar 3 times daily. E11.65 01/13/24  Yes Anders Simmonds, PA-C  cefdinir (OMNICEF) 300 MG capsule Take 1 capsule (300 mg total) by mouth 2 (two) times daily for 10 days. 02/05/24 02/15/24 Yes Rinaldo Ratel, [place] N, FNP  glucose blood (ACCU-CHEK GUIDE TEST) test strip Use to check blood sugar 3 times daily. E11.65 01/13/24  Yes Georgian Co M, PA-C  metFORMIN (GLUCOPHAGE) 500 MG tablet Take 1 tablet (500 mg total) by mouth 2 (two) times daily with a meal. 01/13/24  Yes McClung, Angela M, PA-C  promethazine-dextromethorphan (PROMETHAZINE-DM) 6.25-15 MG/5ML syrup Take 5 mLs by mouth 4 (four) times daily as needed for cough. 02/05/24  Yes Rinaldo Ratel, [place] N, FNP  ketoconazole (NIZORAL) 2 % cream Apply 1 Application topically 2 (two) times daily. Patient not taking: Reported on 01/13/2024 12/20/23   Diron Haddon, [place] N, FNP  ARIPiprazole (ABILIFY) 10 MG tablet Take by mouth.  11/17/20  [provider]  calcium carbonate (OS-CAL) 1250 (500 Ca) MG chewable tablet Chew by mouth. 05/21/17 01/28/20  [provider]  cloNIDine (CATAPRES) 0.1 MG tablet Take by mouth.  01/28/20  [provider]  FLUoxetine (PROZAC) 20 MG capsule Take by mouth.  01/28/20  [provider]  gabapentin (NEURONTIN) 100 MG capsule Take by mouth. 05/21/17 12/12/19  [provider]  promethazine (PHENERGAN) 12.5 MG tablet Take by mouth. 05/21/17 12/12/19  [provider]    Family History Family History  Problem Relation Age of Onset   Healthy Mother    Healthy Father     Social History Social History   Tobacco Use   Smoking status: Former    Current packs/day: 0.00    Average packs/day: 0.5 packs/day for 13.0 years (6.5 ttl pk-yrs)    Types: Cigarettes    Start date: 10/2008    Quit date: 10/2021    Years since quitting: 2.3    Smokeless tobacco: Never  Vaping Use   Vaping status: Never Used  Substance Use Topics   Alcohol use: Yes    Comment: occasionally   Drug use: No     Allergies   Hydrocodone-acetaminophen   Review of Systems Review of Systems  Per HPI  Physical Exam Triage Vital Signs ED Triage Vitals  Encounter Vitals Group     BP 02/05/24 1339 132/84     Systolic BP Percentile --      Diastolic BP Percentile --      Pulse Rate 02/05/24 1339 78     Resp 02/05/24 1339 17     Temp 02/05/24 1339 99 F (37.2 C)     Temp Source 02/05/24 1339 Oral     SpO2 02/05/24 1339 95 %     Weight 02/05/24 1337 248 lb (112.5 kg)     Height 02/05/24 1337 5\' 11"  (1.803 m)     Head Circumference --      Peak Flow --      Pain Score 02/05/24 1337 0     Pain Loc --      Pain Education --      Exclude from Growth Chart --    No data found.  Updated Vital Signs BP 132/84 (BP Location: Left Arm)   Pulse 78   Temp 99 F (37.2 C) (Oral)   Resp 17   Ht 5\' 11"  (1.803 m)   Wt 248 lb (112.5 kg)   SpO2 95%   BMI 34.59 kg/m   Visual Acuity Right Eye Distance:   Left Eye Distance:   Bilateral Distance:    Right Eye Near:   Left Eye Near:    Bilateral Near:     Physical Exam Vitals and nursing note reviewed.  Constitutional:      Appearance: Normal appearance.  HENT:     Head: Normocephalic and atraumatic.     Right Ear: External ear normal.     Left Ear: External ear normal.     Nose: Congestion and rhinorrhea present.     Mouth/Throat:     Mouth: Mucous membranes are moist.     Pharynx: Posterior oropharyngeal erythema present.  Eyes:     Conjunctiva/sclera: Conjunctivae normal.  Cardiovascular:     Rate and Rhythm: Normal rate and regular rhythm.     Heart sounds: Normal heart sounds. No murmur heard. Pulmonary:     Effort: Pulmonary effort is normal. No respiratory distress.     Breath sounds: Normal breath sounds.  Neurological:     General: No focal deficit present.     Mental  Status: He is alert.  Psychiatric:        Mood and Affect: Mood normal.      UC Treatments / Results  Labs (all labs ordered are listed, but only abnormal results are displayed) Labs Reviewed - No data to display  EKG   Radiology  No results found.  Procedures Procedures (including critical care time)  Medications Ordered in UC Medications - No data to display  Initial Impression / Assessment and Plan / UC Course  I have reviewed the triage vital signs and the nursing notes.  Pertinent labs & imaging results that were available during my care of the patient were reviewed by me and considered in my medical decision making (see chart for details).  Vitals in triage reviewed, patient is hemodynamically stable.  Lungs are vesicular, heart with regular rate and rhythm.  Congestion, rhinorrhea and postnasal drip present on physical exam.  Continues to have congestion despite Augmentin, Augmentin has not worked well for him in the past.  Does have persistent cough on physical exam, will cover with Omnicef for bacterial ideology.  Cough management discussed.  Plan of care, follow-up care return precautions given, no questions at this time.     Final Clinical Impressions(s) / UC Diagnoses   Final diagnoses:  Upper respiratory tract infection, unspecified type  Acute cough     Discharge Instructions      Discontinue the Augmentin as it is not working for you.  Start the [place] and take this twice daily with food for the next 10 days.  You can use the [employer] for your cough throughout the day.  At night please use the Promethazine DM.  Do not drink or drive on this medication as may cause sedation or drowsiness.  Sleeping in the middle fire cannot moisten secretions and loosen it as well.  Patient is a 70 movement over the next 3 days, if no improvement or any changes please follow-up with your primary care provider for reevaluation.    ED Prescriptions     Medication  Sig Dispense Auth. Provider   cefdinir (OMNICEF) 300 MG capsule Take 1 capsule (300 mg total) by mouth 2 (two) times daily for 10 days. 20 capsule Rinaldo Ratel, [place] N, [place]   promethazine-dextromethorphan (PROMETHAZINE-DM) 6.25-15 MG/5ML syrup Take 5 mLs by mouth 4 (four) times daily as needed for cough. 118 mL Rinaldo Ratel, [place] N, FNP   benzonatate (TESSALON) 100 MG capsule Take 1 capsule (100 mg total) by mouth every 8 (eight) hours. 21 capsule Salomon Ganser, [place] N, [place]      PDMP not reviewed this encounter.   Merl Bommarito, [place] N, [place] 02/05/24 [phone]

## 2024-02-05 NOTE — ED Triage Notes (Signed)
 Pt states that he has some nasal congestion, coughing, and chest congestion. X4 days Pt states that he was seen recently and given an antibiotic. Pt states that he doesn't feel any better.

## 2024-02-06 NOTE — Telephone Encounter (Signed)
 Please do not call this pt until I have reviewed the labs that were dropped off. I will not write a letter until I have reviewed the labs and make decision as to whether he needs further work up first.

## 2024-02-10 ENCOUNTER — Telehealth: Payer: Self-pay | Admitting: Internal Medicine

## 2024-02-10 NOTE — Telephone Encounter (Signed)
 Called & spoke to the patient. Verified name & DOB. Appointment scheduled for 03/08/2024. Patient confirmed appointment.

## 2024-02-10 NOTE — Telephone Encounter (Signed)
 Additional tested required per Dr.Johnson. Called & spoke to the patient. Verified name & DOB. Appointment scheduled for 03/08/2024. Patient confirmed appointment.

## 2024-02-10 NOTE — Telephone Encounter (Signed)
 I have reviewed the information that pt dropped off. Please give appt.  Will need additional testing.

## 2024-02-15 NOTE — Progress Notes (Signed)
 The patient attended a screening event on 01/12/2024, where her blood pressure was measured at 125/77 mmHg, and his blood glucose was 160 mg/dl, placing him in the diabetic range. During the event, the patient reported that he does not smoke, has insurance, is established with a primary care provider (Dr. Lincoln Renshaw with [facility]), and did not have any indicated SDOHs.   A chart review confirmed that the patient does have an active PCP. The patient's most recent office visit was on 01/13/2024, with Dulce Gibbs, PA-C out of the same clinic as Dr. Lincoln Renshaw. This visit was a day after the screening event. His blood glucose/diabetes were addressed during the visit. He is currently being monitored by his healthcare team. His last office visit with Dr. Lincoln Renshaw was on 11/20/2023. His bp and blood glucose were addressed during that visit.   At this time, no additional support from the [facility] Team is indicated.

## 2024-02-29 ENCOUNTER — Encounter: Payer: Self-pay | Admitting: Pharmacist

## 2024-02-29 ENCOUNTER — Ambulatory Visit: Payer: MEDICAID | Attending: Internal Medicine | Admitting: Pharmacist

## 2024-02-29 VITALS — BP 143/81 | HR 76

## 2024-02-29 DIAGNOSIS — E1169 Type 2 diabetes mellitus with other specified complication: Secondary | ICD-10-CM

## 2024-02-29 DIAGNOSIS — Z7984 Long term (current) use of oral hypoglycemic drugs: Secondary | ICD-10-CM

## 2024-02-29 DIAGNOSIS — E669 Obesity, unspecified: Secondary | ICD-10-CM | POA: Diagnosis not present

## 2024-02-29 NOTE — Progress Notes (Signed)
 S:     Chief Complaint  Patient presents with   Diabetes   Hypertension   Hyperlipidemia   43 y.o. male who presents for diabetes evaluation, education, and management. Patient arrives in  good spirits and presents without  any assistance.   Patient was referred and last seen by Primary Care Provider, Dulce Gibbs, PA, on 01/13/24. At this appointment, patient reported making several lifestyle changes to managed his diabetes including cutting out simple sugars, carbohydrates and increasing intake of lean meats and vegetables. He reported walking 3-4 miles daily. He has not been taking insulin  or oral medications for DM. He did agree to start metformin  500 mg BID while continuing lifestyle changes. A1C decreased from 13.8% to 9.8% from January to March.   PMH is significant for T2DM, PTSD/MDD.  Today, patient reports that he is doing well. He has been able to maintain most of his lifestyle interventions including walking 4 miles per day. He was previously eating only raw vegetables, but now has added in some cooked vegetables and chicken. He would like to get protein from alternative sources other than meat. He reports that he has been frustrated that he has not been cleared by our office to continue donating plasma. For this reason, he does not want to enroll in the LIBERATE study today - though he is interested in CGM. Discussed that he has an appointment scheduled on 03/08/24 to discuss plasma donation clearance.  Patient reports Diabetes was diagnosed in 2024.   Current diabetes medications include: metformin  500 mg BID Current hypertension medications include: none Current hyperlipidemia medications include: none  Patient reports adherence to taking all medications as prescribed.   Do you feel that your medications are working for you? yes Have you been experiencing any side effects to the medications prescribed? no Do you have any problems obtaining medications due to  transportation or finances? no Insurance coverage: [employer]  Patient denies hypoglycemic events.  Reports checking BG three times daily: Reports all numbers are 100-125. Denies any BG > 180 mg/dL. Denies BG < 100 mg/dL.   Patient denies nocturia (nighttime urination) - big improvement, able to sleep for 6 hours straight. Patient denies neuropathy (nerve pain) - has not been an issue for him ever. Patient denies visual changes - blurred vision has improved Patient denies self foot exams.   Patient reported dietary habits: Diet currently consists of raw and cooked vegetables, chicken. Drinking water throughout the day.  Patient-reported exercise habits: Walking 4 miles daily.    O:   ROS  Physical Exam  7 day average blood glucose: does not have meter with him today  Lab Results  Component Value Date   HGBA1C 9.8 (A) 01/13/2024   Vitals:   02/29/24 1528 02/29/24 1538  BP: (!) 143/79 (!) 143/81  Pulse: 73 76    BP Readings from Last 3 Encounters:  02/29/24 (!) 143/81  02/05/24 132/84  02/01/24 137/82   Lipid Panel     Component Value Date/Time   CHOL 233 (H) 11/20/2023 1204   TRIG 989 (HH) 11/20/2023 1204   HDL 29 (L) 11/20/2023 1204   CHOLHDL 8.0 (H) 11/20/2023 1204   CHOLHDL 6.6 07/06/2022 0610   VLDL 66 (H) 07/06/2022 0610   LDLCALC Comment (A) 11/20/2023 1204    Clinical Atherosclerotic Cardiovascular Disease (ASCVD): No  The 10-year ASCVD risk score (Arnett DK, et al., 2019) is: 10.8%   Values used to calculate the score:     Age:  43 years     Sex: Male     Is Non-Hispanic African American: Yes     Diabetic: Yes     Tobacco smoker: No     Systolic Blood Pressure: 143 mmHg     Is BP treated: No     HDL Cholesterol: 29 mg/dL     Total Cholesterol: 233 mg/dL   Patient is participating in a Managed Medicaid Plan:  No  A/P: Diabetes longstanding, currently controlled per patient reported FBG ,130 mg/dL and 2-hr PPG <409 mg/dL. Last A1C of  9.8% uncontrolled above goal < 7%. Appropriate to continue current regimen given stark improvement in BG. Patient is tolerating metformin  well. Can consider increasing to full dose if follow-up A1C continues to be uncontrolled. Congratulated patient on improvement!   -Continued metformin  500 mg BID.  -Patient educated on purpose, proper use, and potential adverse effects of metformin .  -Extensively discussed pathophysiology of diabetes, recommended lifestyle interventions, dietary effects on blood sugar control.  -Counseled on s/sx of and management of hypoglycemia.  -Continue to monitor BG three times daily.  -Next A1c anticipated June 2025.   ASCVD risk - primary prevention in patient with diabetes. Last LDL not able to be calculated due to TG >900 mg/dL. Suspect TG have improved with improved glycemic control.. ASCVD risk factors include T2DM and 10-year ASCVD risk score of 10.8%. Moderate to high intensity statin indicated, will discuss at follow-up.  Hypertension longstanding, currently uncontrolled, but patient was worked up from a stressful phone call prior to the visit. Blood pressure goal of <130/80 mmHg. UACR was slightly elevated at 34 mg/g. Patient would likely benefit from an ARB, but he prefers to focus on lifestyle changes at this time. Encouraged him to monitor at home and bring log to follow-up appointment.   Written patient instructions provided. Patient verbalized understanding of treatment plan.  Total time in face to face counseling 30 minutes.    Follow-up:  Pharmacist 04/14/24 PCP clinic visit 03/08/24  Arthea Larsson, PharmD PGY1 Pharmacy Resident

## 2024-03-08 ENCOUNTER — Telehealth: Payer: Self-pay | Admitting: Internal Medicine

## 2024-03-08 ENCOUNTER — Ambulatory Visit: Payer: MEDICAID | Attending: Internal Medicine | Admitting: Internal Medicine

## 2024-03-08 ENCOUNTER — Encounter: Payer: Self-pay | Admitting: Internal Medicine

## 2024-03-08 VITALS — BP 135/74 | HR 72 | Temp 98.2°F | Ht 71.0 in | Wt 267.0 lb

## 2024-03-08 DIAGNOSIS — E669 Obesity, unspecified: Secondary | ICD-10-CM | POA: Diagnosis not present

## 2024-03-08 DIAGNOSIS — E785 Hyperlipidemia, unspecified: Secondary | ICD-10-CM

## 2024-03-08 DIAGNOSIS — E1169 Type 2 diabetes mellitus with other specified complication: Secondary | ICD-10-CM

## 2024-03-08 DIAGNOSIS — I10 Essential (primary) hypertension: Secondary | ICD-10-CM | POA: Diagnosis not present

## 2024-03-08 DIAGNOSIS — E119 Type 2 diabetes mellitus without complications: Secondary | ICD-10-CM

## 2024-03-08 DIAGNOSIS — R779 Abnormality of plasma protein, unspecified: Secondary | ICD-10-CM

## 2024-03-08 DIAGNOSIS — Z7984 Long term (current) use of oral hypoglycemic drugs: Secondary | ICD-10-CM

## 2024-03-08 DIAGNOSIS — E1159 Type 2 diabetes mellitus with other circulatory complications: Secondary | ICD-10-CM

## 2024-03-08 DIAGNOSIS — F4310 Post-traumatic stress disorder, unspecified: Secondary | ICD-10-CM

## 2024-03-08 DIAGNOSIS — Z6837 Body mass index (BMI) 37.0-37.9, adult: Secondary | ICD-10-CM | POA: Diagnosis not present

## 2024-03-08 LAB — GLUCOSE, POCT (MANUAL RESULT ENTRY): POC Glucose: 176 mg/dL — AB (ref 70–99)

## 2024-03-08 MED ORDER — ROSUVASTATIN CALCIUM 10 MG PO TABS: 10.0000 mg | ORAL_TABLET | Freq: Every day | ORAL | 3 refills | Status: DC

## 2024-03-08 MED ORDER — ACCU-CHEK GUIDE TEST VI STRP: ORAL_STRIP | 6 refills | Status: DC

## 2024-03-08 MED ORDER — ACCU-CHEK SOFTCLIX LANCETS MISC: 6 refills | Status: DC
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
## 2024-03-08 NOTE — Patient Instructions (Signed)
 Your cholesterol is elevated.  This puts you at risk for heart attack and strokes.  Recommend starting a cholesterol-lowering medication called rosuvastatin .  Prescription sent to your pharmacy.  We will recheck serum electrophoresis today.  Blood pressure goal is 130/80 or lower.  Continue to work on lifestyle changes including decreasing salt intake and increasing fresh fruits and vegetables in the diet.

## 2024-03-08 NOTE — Telephone Encounter (Signed)
 Patient requesting a PTSD referral, Can you please advise. Thank you.

## 2024-03-09 ENCOUNTER — Ambulatory Visit (HOSPITAL_COMMUNITY)
Admission: EM | Admit: 2024-03-09 | Discharge: 2024-03-09 | Disposition: A | Discharge status: Home / Self Care | Payer: MEDICAID | Attending: Nurse Practitioner | Admitting: Nurse Practitioner

## 2024-03-09 DIAGNOSIS — F332 Major depressive disorder, recurrent severe without psychotic features: Secondary | ICD-10-CM | POA: Insufficient documentation

## 2024-03-09 DIAGNOSIS — Z5971 Insufficient health insurance coverage: Secondary | ICD-10-CM | POA: Insufficient documentation

## 2024-03-09 DIAGNOSIS — Z79899 Other long term (current) drug therapy: Secondary | ICD-10-CM | POA: Insufficient documentation

## 2024-03-09 DIAGNOSIS — G4700 Insomnia, unspecified: Secondary | ICD-10-CM | POA: Insufficient documentation

## 2024-03-09 DIAGNOSIS — F4310 Post-traumatic stress disorder, unspecified: Secondary | ICD-10-CM | POA: Insufficient documentation

## 2024-03-09 DIAGNOSIS — F514 Sleep terrors [night terrors]: Secondary | ICD-10-CM | POA: Insufficient documentation

## 2024-03-09 MED ORDER — ARIPIPRAZOLE 2 MG PO TABS: 2.0000 mg | ORAL_TABLET | Freq: Every evening | ORAL | 0 refills | Status: DC

## 2024-03-09 MED ORDER — PRAZOSIN HCL 1 MG PO CAPS: 1.0000 mg | ORAL_CAPSULE | Freq: Every day | ORAL | 0 refills | Status: DC

## 2024-03-09 MED ORDER — FLUOXETINE HCL 10 MG PO CAPS: 10.0000 mg | ORAL_CAPSULE | Freq: Every day | ORAL | 0 refills | Status: DC

## 2024-03-09 MED ORDER — FLUOXETINE HCL 10 MG PO CAPS: 10.0000 mg | ORAL_CAPSULE | Freq: Every day | ORAL | Status: DC
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
## 2024-03-09 NOTE — Discharge Instructions (Addendum)
 Follow up with [facility] Residents Only  Walk-in hours for open access (medication management and therapy) are Monday - Friday 8 am to 11 am. Appointments are limited, so please arrive at 7:00 am. Upon arrival, please complete the form on the clipboard located at the front desk. If there are no clipboards available, all appointments have been filled for that day.  [facility] [address] [place]  [phone]

## 2024-03-09 NOTE — Addendum Note (Signed)
 Addended by: Concetta Dee B on: 03/09/2024 08:15 AM   Modules accepted: Orders

## 2024-03-09 NOTE — Telephone Encounter (Signed)
Referral submitted to [facility].
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
## 2024-03-10 ENCOUNTER — Ambulatory Visit (HOSPITAL_COMMUNITY): Payer: MEDICAID | Admitting: Physician Assistant

## 2024-03-10 ENCOUNTER — Telehealth (HOSPITAL_COMMUNITY): Payer: Self-pay | Admitting: *Deleted

## 2024-03-10 ENCOUNTER — Encounter (HOSPITAL_COMMUNITY): Payer: Self-pay | Admitting: Physician Assistant

## 2024-03-10 VITALS — BP 134/80 | HR 71 | Temp 98.4°F | Ht 71.0 in | Wt 267.8 lb

## 2024-03-10 DIAGNOSIS — F320 Major depressive disorder, single episode, mild: Secondary | ICD-10-CM

## 2024-03-10 DIAGNOSIS — F411 Generalized anxiety disorder: Secondary | ICD-10-CM | POA: Diagnosis not present

## 2024-03-10 DIAGNOSIS — F5105 Insomnia due to other mental disorder: Secondary | ICD-10-CM

## 2024-03-10 DIAGNOSIS — F4310 Post-traumatic stress disorder, unspecified: Secondary | ICD-10-CM

## 2024-03-10 DIAGNOSIS — F99 Mental disorder, not otherwise specified: Secondary | ICD-10-CM

## 2024-03-10 LAB — HEPATIC FUNCTION PANEL
ALT: 43 IU/L (ref 0–44)
AST: 31 IU/L (ref 0–40)
Albumin: 4.6 g/dL (ref 4.1–5.1)
Alkaline Phosphatase: 87 IU/L (ref 44–121)
Bilirubin Total: <0.2 mg/dL (ref 0.0–1.2)
Bilirubin, Direct: 0.08 mg/dL (ref 0.00–0.40)

## 2024-03-10 LAB — PE AND FLC, SERUM
A/G Ratio: 1 (ref 0.7–1.7)
Albumin ELP: 3.9 g/dL (ref 2.9–4.4)
Alpha 1: 0.2 g/dL (ref 0.0–0.4)
Alpha 2: 0.6 g/dL (ref 0.4–1.0)
Beta: 1.4 g/dL — ABNORMAL HIGH (ref 0.7–1.3)
Gamma Globulin: 1.8 g/dL (ref 0.4–1.8)
Globulin, Total: 4 g/dL — ABNORMAL HIGH (ref 2.2–3.9)
Ig Kappa Free Light Chain: 32.5 mg/L — ABNORMAL HIGH (ref 3.3–19.4)
Ig Lambda Free Light Chain: 23.2 mg/L (ref 5.7–26.3)
KAPPA/LAMBDA RATIO: 1.4 (ref 0.26–1.65)
Total Protein: 7.9 g/dL (ref 6.0–8.5)

## 2024-03-10 LAB — LIPID PANEL
Chol/HDL Ratio: 7.8 ratio — ABNORMAL HIGH (ref 0.0–5.0)
Cholesterol, Total: 241 mg/dL — ABNORMAL HIGH (ref 100–199)
HDL: 31 mg/dL — ABNORMAL LOW (ref >39)
LDL Chol Calc (NIH): 160 mg/dL — ABNORMAL HIGH (ref 0–99)
Triglycerides: 266 mg/dL — ABNORMAL HIGH (ref 0–149)
VLDL Cholesterol Cal: 50 mg/dL — ABNORMAL HIGH (ref 5–40)

## 2024-03-10 MED ORDER — ARIPIPRAZOLE 2 MG PO TABS: 2.0000 mg | ORAL_TABLET | Freq: Every evening | ORAL | 1 refills | Status: DC

## 2024-03-10 MED ORDER — PRAZOSIN HCL 1 MG PO CAPS: 1.0000 mg | ORAL_CAPSULE | Freq: Every day | ORAL | 1 refills | Status: DC

## 2024-03-10 MED ORDER — FLUOXETINE HCL 20 MG PO CAPS: 20.0000 mg | ORAL_CAPSULE | Freq: Every day | ORAL | 1 refills | Status: DC

## 2024-03-10 MED ORDER — TRAZODONE HCL 50 MG PO TABS: 50.0000 mg | ORAL_TABLET | Freq: Every day | ORAL | 1 refills | Status: DC
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
## 2024-03-10 NOTE — Telephone Encounter (Signed)
 [facility] notified his aripiprazole was approved till 03/10/25.

## 2024-03-11 ENCOUNTER — Ambulatory Visit (HOSPITAL_COMMUNITY): Payer: MEDICAID | Admitting: Licensed Clinical Social Worker

## 2024-03-11 DIAGNOSIS — F4310 Post-traumatic stress disorder, unspecified: Secondary | ICD-10-CM | POA: Diagnosis not present
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
## 2024-03-15 NOTE — Telephone Encounter (Signed)
 Patient identified by name and date of birth.  Patient aware of response from provider and voiced understanding.

## 2024-03-16 ENCOUNTER — Other Ambulatory Visit: Payer: Self-pay | Admitting: Internal Medicine

## 2024-03-16 ENCOUNTER — Ambulatory Visit: Payer: Self-pay

## 2024-03-16 DIAGNOSIS — R768 Other specified abnormal immunological findings in serum: Secondary | ICD-10-CM

## 2024-03-22 ENCOUNTER — Ambulatory Visit: Payer: MEDICAID | Admitting: Internal Medicine

## 2024-04-06 ENCOUNTER — Other Ambulatory Visit (HOSPITAL_COMMUNITY): Payer: Self-pay | Admitting: Physician Assistant

## 2024-04-06 DIAGNOSIS — F411 Generalized anxiety disorder: Secondary | ICD-10-CM

## 2024-04-06 DIAGNOSIS — F5105 Insomnia due to other mental disorder: Secondary | ICD-10-CM

## 2024-04-06 DIAGNOSIS — F320 Major depressive disorder, single episode, mild: Secondary | ICD-10-CM

## 2024-04-06 DIAGNOSIS — F4310 Post-traumatic stress disorder, unspecified: Secondary | ICD-10-CM

## 2024-04-11 ENCOUNTER — Ambulatory Visit (INDEPENDENT_AMBULATORY_CARE_PROVIDER_SITE_OTHER): Payer: MEDICAID | Admitting: Licensed Clinical Social Worker

## 2024-04-11 DIAGNOSIS — F320 Major depressive disorder, single episode, mild: Secondary | ICD-10-CM

## 2024-04-11 DIAGNOSIS — F4310 Post-traumatic stress disorder, unspecified: Secondary | ICD-10-CM | POA: Diagnosis not present
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
## 2024-04-14 ENCOUNTER — Ambulatory Visit: Payer: MEDICAID | Admitting: Pharmacist

## 2024-04-22 ENCOUNTER — Ambulatory Visit (HOSPITAL_COMMUNITY): Payer: MEDICAID | Admitting: Licensed Clinical Social Worker

## 2024-04-26 ENCOUNTER — Ambulatory Visit (HOSPITAL_COMMUNITY): Payer: MEDICAID | Admitting: Licensed Clinical Social Worker

## 2024-04-27 ENCOUNTER — Encounter (HOSPITAL_COMMUNITY): Payer: MEDICAID | Admitting: Physician Assistant

## 2024-04-28 ENCOUNTER — Encounter: Payer: Self-pay | Admitting: Pharmacist

## 2024-04-28 ENCOUNTER — Ambulatory Visit: Payer: MEDICAID | Attending: Internal Medicine | Admitting: Pharmacist

## 2024-04-28 DIAGNOSIS — Z7984 Long term (current) use of oral hypoglycemic drugs: Secondary | ICD-10-CM | POA: Diagnosis not present

## 2024-04-28 DIAGNOSIS — E1169 Type 2 diabetes mellitus with other specified complication: Secondary | ICD-10-CM | POA: Diagnosis not present

## 2024-04-28 DIAGNOSIS — E669 Obesity, unspecified: Secondary | ICD-10-CM | POA: Diagnosis not present

## 2024-04-28 LAB — POCT GLYCOSYLATED HEMOGLOBIN (HGB A1C): HbA1c, POC (controlled diabetic range): 8 % — AB (ref 0.0–7.0)
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
## 2024-05-09 ENCOUNTER — Inpatient Hospital Stay: Payer: MEDICAID | Attending: Hematology and Oncology | Admitting: Hematology and Oncology

## 2024-05-09 ENCOUNTER — Inpatient Hospital Stay: Payer: MEDICAID

## 2024-05-09 VITALS — BP 142/83 | HR 84 | Temp 98.3°F | Resp 15 | Wt 274.0 lb

## 2024-05-09 DIAGNOSIS — Z885 Allergy status to narcotic agent status: Secondary | ICD-10-CM | POA: Insufficient documentation

## 2024-05-09 DIAGNOSIS — Z5986 Financial insecurity: Secondary | ICD-10-CM | POA: Insufficient documentation

## 2024-05-09 DIAGNOSIS — R768 Other specified abnormal immunological findings in serum: Secondary | ICD-10-CM | POA: Insufficient documentation

## 2024-05-09 DIAGNOSIS — Z87891 Personal history of nicotine dependence: Secondary | ICD-10-CM | POA: Insufficient documentation

## 2024-05-09 DIAGNOSIS — Z794 Long term (current) use of insulin: Secondary | ICD-10-CM | POA: Diagnosis not present

## 2024-05-09 DIAGNOSIS — D472 Monoclonal gammopathy: Secondary | ICD-10-CM

## 2024-05-09 DIAGNOSIS — Z79899 Other long term (current) drug therapy: Secondary | ICD-10-CM | POA: Insufficient documentation

## 2024-05-09 DIAGNOSIS — Z7984 Long term (current) use of oral hypoglycemic drugs: Secondary | ICD-10-CM | POA: Diagnosis not present

## 2024-05-09 DIAGNOSIS — E119 Type 2 diabetes mellitus without complications: Secondary | ICD-10-CM | POA: Diagnosis not present

## 2024-05-09 LAB — CBC WITH DIFFERENTIAL (CANCER CENTER ONLY)
Abs Immature Granulocytes: 0.03 K/uL (ref 0.00–0.07)
Basophils Absolute: 0 K/uL (ref 0.0–0.1)
Basophils Relative: 1 %
Eosinophils Absolute: 0.1 K/uL (ref 0.0–0.5)
Eosinophils Relative: 3 %
HCT: 41.9 % (ref 39.0–52.0)
Hemoglobin: 14.4 g/dL (ref 13.0–17.0)
Immature Granulocytes: 1 %
Lymphocytes Relative: 34 %
Lymphs Abs: 1.6 K/uL (ref 0.7–4.0)
MCH: 27.8 pg (ref 26.0–34.0)
MCHC: 34.4 g/dL (ref 30.0–36.0)
MCV: 80.9 fL (ref 80.0–100.0)
Monocytes Absolute: 0.4 K/uL (ref 0.1–1.0)
Monocytes Relative: 9 %
Neutro Abs: 2.6 K/uL (ref 1.7–7.7)
Neutrophils Relative %: 52 %
Platelet Count: 243 K/uL (ref 150–400)
RBC: 5.18 MIL/uL (ref 4.22–5.81)
RDW: 12.5 % (ref 11.5–15.5)
Smear Review: NORMAL
WBC Count: 4.8 K/uL (ref 4.0–10.5)
nRBC: 0 % (ref 0.0–0.2)

## 2024-05-09 LAB — CMP (CANCER CENTER ONLY)
ALT: 34 U/L (ref 0–44)
AST: 22 U/L (ref 15–41)
Albumin: 3.9 g/dL (ref 3.5–5.0)
Alkaline Phosphatase: 69 U/L (ref 38–126)
Anion gap: 6 (ref 5–15)
BUN: 11 mg/dL (ref 6–20)
CO2: 27 mmol/L (ref 22–32)
Calcium: 9 mg/dL (ref 8.9–10.3)
Chloride: 104 mmol/L (ref 98–111)
Creatinine: 1.1 mg/dL (ref 0.61–1.24)
GFR, Estimated: >60 mL/min (ref >60)
Glucose, Bld: 209 mg/dL — ABNORMAL HIGH (ref 70–99)
Potassium: 4.2 mmol/L (ref 3.5–5.1)
Sodium: 137 mmol/L (ref 135–145)
Total Bilirubin: 0.2 mg/dL (ref 0.0–1.2)
Total Protein: 6.9 g/dL (ref 6.5–8.1)

## 2024-05-09 LAB — LACTATE DEHYDROGENASE: LDH: 150 U/L (ref 98–192)

## 2024-05-09 NOTE — Progress Notes (Unsigned)
 [facility] [phone]   [phone]  INITIAL CONSULT NOTE  Patient Care Team: Vicci Barnie NOVAK, MD as PCP - General (Internal Medicine) Patient, No Pcp Per (General Practice)  Hematological/Oncological History # Elevated Kappa Serum Free Light Chain  03/08/2024: Kappa 32.5, Lambda 23.2, ratio 1.40, M protein undetectable.  05/09/2024: establish care with Dr. Federico   CHIEF COMPLAINTS/PURPOSE OF CONSULTATION:  Elevated Serum Free Light Chain Ratio   HISTORY OF PRESENTING ILLNESS:  Jeffery Santos 43 y.o. male with medical history significant for acid reflux and type 2 diabetes who presents for evaluation of an elevated serum free light chain ratio.   On review of the previous records Jeffery Santos had labs collected on 03/08/2024 which revealed  Kappa 32.5, Lambda 23.2, ratio 1.40, M protein undetectable.  Due to concern for these findings the patient was referred to hematology for further evaluation and management.  On exam today Jeffery Santos reports that to help make ends meet he has been donating plasma recently.  He was told at 1 point that his protein levels were high and they could not except his plasma.  Unfortunately he also was diagnosed with type 2 diabetes and as he is taking insulin  he is no longer a candidate for donating as well.  He reports he does his best to try to exercise and walks 5 miles per day.  He reports that he tries to eat healthy foods out of his garden.  He does not eat red meat and does enjoy chicken and salmon.  He is doing his best to try to take care of his health.  On further discussion he reports that his dad is still alive and healthy.  His mother has lupus.  He has 1 brother with acid reflux.  He has 2 healthy children.  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  He otherwise denies any fevers, chills, sweats, nausea, vomiting or diarrhea.  A full 10 point  ROS is otherwise negative.  MEDICAL HISTORY:  Past Medical History:  Diagnosis Date   Acid reflux    Diabetes mellitus without complication (HCC)     SURGICAL HISTORY: Past Surgical History:  Procedure Laterality Date   ANKLE ARTHROSCOPY WITH OPEN REDUCTION INTERNAL FIXATION (ORIF)     HERNIA REPAIR Left 2001   WOUND EXPLORATION Left 05/01/2021   Procedure: WOUND EXPLORATION-Left small finger wound exploration and tendon repair;  Surgeon: Shari Easter, MD;  Location: [facility] OR;  Service: Orthopedics;  Laterality: Left;    SOCIAL HISTORY: Social History   Socioeconomic History   Marital status: Single    Spouse name: Not on file   Number of children: Not on file   Years of education: Not on file   Highest education level: Not on file  Occupational History   Not on file  Tobacco Use   Smoking status: Former    Current packs/day: 0.00    Average packs/day: 0.5 packs/day for 13.0 years (6.5 ttl pk-yrs)    Types: Cigarettes    Start date: 10/2008    Quit date: 10/2021    Years since quitting: 2.6   Smokeless tobacco: Never  Vaping Use   Vaping status: Never Used  Substance and Sexual Activity   Alcohol use: Yes    Comment: occasionally   Drug use: No   Sexual activity: Yes  Other Topics Concern   Not on file  Social History Narrative  Not on file   Social Drivers of Health   Financial Resource Strain: Medium Risk (03/08/2024)   Overall Financial Resource Strain (CARDIA)    Difficulty of Paying Living Expenses: Somewhat hard  Food Insecurity: No Food Insecurity (05/09/2024)   Hunger Vital Sign    Worried About Running Out of Food in the Last Year: Never true    Ran Out of Food in the Last Year: Never true  Transportation Needs: No Transportation Needs (05/09/2024)   PRAPARE - Administrator, Civil Service (Medical): No    Lack of Transportation (Non-Medical): No  Physical Activity: Inactive (03/08/2024)   Exercise Vital Sign    Days of Exercise per Week: 0  days    Minutes of Exercise per Session: 0 min  Stress: Stress Concern Present (03/08/2024)   [employer] of Occupational Health - Occupational Stress Questionnaire    Feeling of Stress : Very much  Social Connections: Moderately Isolated (03/08/2024)   Social Connection and Isolation Panel    Frequency of Communication with Friends and Family: Once a week    Frequency of Social Gatherings with Friends and Family: Never    Attends Religious Services: 1 to 4 times per year    Active Member of [employer] or Organizations: Yes    Attends Banker Meetings: 1 to 4 times per year    Marital Status: Never married  Intimate Partner Violence: Unknown (05/09/2024)   Humiliation, Afraid, Rape, and Kick questionnaire    Fear of Current or Ex-Partner: No    Emotionally Abused: No    Physically Abused: Not on file    Sexually Abused: Not on file    FAMILY HISTORY: Family History  Problem Relation Age of Onset   Healthy Mother    Healthy Father     ALLERGIES:  is allergic to hydrocodone -acetaminophen .  MEDICATIONS:  Current Outpatient Medications  Medication Sig Dispense Refill   buPROPion (WELLBUTRIN) 75 MG tablet Take 75 mg by mouth 2 (two) times daily.     prazosin  (MINIPRESS ) 1 MG capsule TAKE 1 CAPSULE BY MOUTH AT BEDTIME. 90 capsule 1   Accu-Chek Softclix Lancets lancets Use to check blood sugar 3 times daily. E11.65 100 each 6   ARIPiprazole  (ABILIFY ) 2 MG tablet TAKE 1 TABLET BY MOUTH AT BEDTIME. 90 tablet 1   Blood Glucose Monitoring Suppl (ACCU-CHEK GUIDE) w/Device KIT Use to check blood sugar 3 times daily. E11.65 1 kit 0   glucose blood (ACCU-CHEK GUIDE TEST) test strip Use to check blood sugar 3 times daily. E11.65 100 each 6   ketoconazole  (NIZORAL ) 2 % cream Apply 1 Application topically 2 (two) times daily. 30 g 0   metFORMIN  (GLUCOPHAGE ) 500 MG tablet Take 1 tablet (500 mg total) by mouth 2 (two) times daily with a meal. 180 tablet 1   promethazine -dextromethorphan  (PROMETHAZINE -DM) 6.25-15 MG/5ML syrup Take 5 mLs by mouth 4 (four) times daily as needed for cough. 118 mL 0   rosuvastatin  (CRESTOR ) 10 MG tablet Take 1 tablet (10 mg total) by mouth daily. 90 tablet 3   traZODone  (DESYREL ) 50 MG tablet TAKE 1 TABLET BY MOUTH EVERYDAY AT BEDTIME 90 tablet 1   No current facility-administered medications for this visit.    REVIEW OF SYSTEMS:   Constitutional: ( - ) fevers, ( - )  chills , ( - ) night sweats Eyes: ( - ) blurriness of vision, ( - ) double vision, ( - ) watery eyes Ears, nose, mouth, throat, and  face: ( - ) mucositis, ( - ) sore throat Respiratory: ( - ) cough, ( - ) dyspnea, ( - ) wheezes Cardiovascular: ( - ) palpitation, ( - ) chest discomfort, ( - ) lower extremity swelling Gastrointestinal:  ( - ) nausea, ( - ) heartburn, ( - ) change in bowel habits Skin: ( - ) abnormal skin rashes Lymphatics: ( - ) new lymphadenopathy, ( - ) easy bruising Neurological: ( - ) numbness, ( - ) tingling, ( - ) new weaknesses Behavioral/Psych: ( - ) mood change, ( - ) new changes  All other systems were reviewed with the patient and are negative.  PHYSICAL EXAMINATION:  Vitals:   05/09/24 1111  BP: (!) 142/83  Pulse: 84  Resp: 15  Temp: 98.3 F (36.8 C)  SpO2: 95%   Filed Weights   05/09/24 1111  Weight: 274 lb (124.3 kg)    GENERAL: well appearing middle-aged African-American male in NAD  SKIN: skin color, texture, turgor are normal, no rashes or significant lesions EYES: conjunctiva are pink and non-injected, sclera clear LUNGS: clear to auscultation and percussion with normal breathing effort HEART: regular rate & rhythm and no murmurs and no lower extremity edema Musculoskeletal: no cyanosis of digits and no clubbing  PSYCH: alert & oriented x 3, fluent speech NEURO: no focal motor/sensory deficits  LABORATORY DATA:  I have reviewed the data as listed    Latest Ref Rng & Units 05/09/2024   12:22 PM 11/20/2023   12:04 PM 07/07/2022     2:32 AM  CBC  WBC 4.0 - 10.5 K/uL 4.8  4.7  5.4   Hemoglobin 13.0 - 17.0 g/dL 85.5  84.0  86.3   Hematocrit 39.0 - 52.0 % 41.9  48.4  39.4   Platelets 150 - 400 K/uL 243  270  205        Latest Ref Rng & Units 05/09/2024   12:22 PM 03/08/2024   10:39 AM 01/13/2024    4:41 PM  CMP  Glucose 70 - 99 mg/dL 790   898   BUN 6 - 20 mg/dL 11   8   Creatinine 9.38 - 1.24 mg/dL 8.89   8.91   Sodium 864 - 145 mmol/L 137   140   Potassium 3.5 - 5.1 mmol/L 4.2   4.6   Chloride 98 - 111 mmol/L 104   100   CO2 22 - 32 mmol/L 27   25   Calcium  8.9 - 10.3 mg/dL 9.0   9.6   Total Protein 6.5 - 8.1 g/dL 6.9  7.9    Total Bilirubin 0.0 - 1.2 mg/dL 0.2  <9.7    Alkaline Phos 38 - 126 U/L 69  87    AST 15 - 41 U/L 22  31    ALT 0 - 44 U/L 34  43       ASSESSMENT & PLAN Jeffery Santos 43 y.o. male with medical history significant for acid reflux and type 2 diabetes who presents for evaluation of an elevated serum free light chain ratio.   After review of the labs, review of the records, and discussion with the patient the patients findings are most consistent with a mild elevation of the kappa light chains with no evidence of an abnormal serum free light chain ratio.  At this time the patient does not appear to have a monoclonal gammopathy given that he has no detectable M protein in the serum.  I would recommend repeating the  SPEP and serum free light chains with the addition of a UPEP.  Monoclonal Gammopathies are a group of medical conditions defined by the presence of a monoclonal protein (an M protein) in the blood or urine. Monoclonal gammopathies include monoclonal gammopathy of unknown significance (MGUS), Monoclonal gammopathies of renal or neurological significance,  smoldering multiple myeloma (SMM), multiple myeloma (MM), AL amyloidosis, and Waldenstrom macroglobulinemia. The goal of the initial workup is to determine which monoclonal gammopathy a patient has. The workup consists of evaluating  protein in the serum (with serum protein electrophoresis (SPEP) and serum free light chains) , evaluating protein in the urine (UPEP), and evaluation of the skeleton (DG Bone Met Survey) to assure no lytic lesions. Baseline bloodwork includes CMP and CBC. If no CRAB criteria or high risk criteria are noted then the diagnosis is MGUS. MGUS must be followed with bloodwork periodically to assure it does not convert to multiple myeloma (occurs to approximately 1% of patients per year). If there are CRAB criteria or high risk features (such as elevated serum free light chain ratio (taking into account renal function), a non IgG M protein, or M protein >1.5) then a bone marrow biopsy must be pursued.    #Concern for Monoclonal Gammopathy of Undetermined Significance # Elevated Kappa Serum Free Light Chain --today will order an SPEP, UPEP, SFLC and beta 2 microglobulin --additionally will collect new baseline CBC, CMP, and LDH --recommend a metastatic bone survey to assess for lytic lesions if there is evidence of an M protein in the above workup.  --will consider the need for a bone marrow biopsy pending the above results --RTC pending the results of the above studies.     Orders Placed This Encounter  Procedures   CBC with Differential (Cancer Center Only)    Standing Status:   Future    Number of Occurrences:   1    Expiration Date:   05/09/2025   CMP (Cancer Center only)    Standing Status:   Future    Number of Occurrences:   1    Expiration Date:   05/09/2025   Lactate dehydrogenase (LDH)    Standing Status:   Future    Number of Occurrences:   1    Expiration Date:   05/09/2025   Multiple Myeloma Panel (SPEP&IFE w/QIG)    Standing Status:   Future    Number of Occurrences:   1    Expiration Date:   05/09/2025   Kappa/lambda light chains    Standing Status:   Future    Number of Occurrences:   1    Expiration Date:   05/09/2025   24-Hr Ur UPEP/UIFE/Light Chains/TP    Standing Status:   Future     Expiration Date:   05/09/2025   Beta 2 microglobulin    Standing Status:   Future    Number of Occurrences:   1    Expiration Date:   05/09/2025    All questions were answered. The patient knows to call the clinic with any problems, questions or concerns.  A total of more than 60 minutes were spent on this encounter with face-to-face time and non-face-to-face time, including preparing to see the patient, ordering tests and/or medications, counseling the patient and coordination of care as outlined above.   Norleen IVAR Kidney, MD Department of Hematology/Oncology [facility] Phone: [phone] Pager: [phone] Email: [email or web address]  05/10/2024 9:23 AM

## 2024-05-10 ENCOUNTER — Encounter: Payer: Self-pay | Admitting: Internal Medicine

## 2024-05-10 ENCOUNTER — Ambulatory Visit: Payer: MEDICAID | Attending: Internal Medicine | Admitting: Internal Medicine

## 2024-05-10 VITALS — BP 123/79 | HR 75 | Temp 98.1°F | Ht 71.0 in | Wt 267.0 lb

## 2024-05-10 DIAGNOSIS — I152 Hypertension secondary to endocrine disorders: Secondary | ICD-10-CM

## 2024-05-10 DIAGNOSIS — Z7984 Long term (current) use of oral hypoglycemic drugs: Secondary | ICD-10-CM

## 2024-05-10 DIAGNOSIS — E119 Type 2 diabetes mellitus without complications: Secondary | ICD-10-CM | POA: Diagnosis not present

## 2024-05-10 DIAGNOSIS — E1159 Type 2 diabetes mellitus with other circulatory complications: Secondary | ICD-10-CM | POA: Diagnosis not present

## 2024-05-10 DIAGNOSIS — E1169 Type 2 diabetes mellitus with other specified complication: Secondary | ICD-10-CM

## 2024-05-10 DIAGNOSIS — E669 Obesity, unspecified: Secondary | ICD-10-CM | POA: Diagnosis not present

## 2024-05-10 DIAGNOSIS — E785 Hyperlipidemia, unspecified: Secondary | ICD-10-CM

## 2024-05-10 LAB — BETA 2 MICROGLOBULIN, SERUM: Beta-2 Microglobulin: 1.5 mg/L (ref 0.6–2.4)

## 2024-05-10 LAB — KAPPA/LAMBDA LIGHT CHAINS
Kappa free light chain: 35.5 mg/L — ABNORMAL HIGH (ref 3.3–19.4)
Kappa, lambda light chain ratio: 1.26 (ref 0.26–1.65)
Lambda free light chains: 28.2 mg/L — ABNORMAL HIGH (ref 5.7–26.3)

## 2024-05-10 LAB — GLUCOSE, POCT (MANUAL RESULT ENTRY): POC Glucose: 198 mg/dL — AB (ref 70–99)

## 2024-05-10 MED ORDER — ACCU-CHEK GUIDE W/DEVICE KIT: PACK | 0 refills | Status: DC

## 2024-05-10 MED ORDER — KETOCONAZOLE 2 % EX CREA: 1.0000 | TOPICAL_CREAM | Freq: Two times a day (BID) | CUTANEOUS | 0 refills | Status: DC
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
## 2024-05-10 NOTE — Patient Instructions (Signed)
 VISIT SUMMARY:  Today, you came in for a follow-up on your abnormal blood test results and diabetes management. We discussed your recent evaluations, current health status, and future plans for managing your conditions.  YOUR PLAN:  -ELEVATED LDL AND ELECTROPHORESIS ABNORMALITY: You were referred to an oncologist due to elevated LDL cholesterol and abnormal protein levels in your blood. These findings do not currently indicate cancer. Please complete the urine sample collection, and we will await the results of the pending blood tests and urine sample. If the results are normal, we will sign off on your plasma donation form.  -TYPE 2 DIABETES MELLITUS: Your diabetes management is improving, with your HbA1c reduced from 13.8% to 8%. You are taking metformin  500 mg twice daily. Since your glucometer is damaged, we will provide you with a new one. Continue with your current diet and exercise routine, and aim to reduce your HbA1c to below 7%.  -HYPERLIPIDEMIA: You are managing your cholesterol levels with medication, diet, and exercise. Your triglycerides have decreased significantly from 989 mg/dL to 733 mg/dL. Continue with your current medication and lifestyle changes. We will reevaluate your weight management and the potential need for medication in six months.  -GENERAL HEALTH MAINTENANCE: We discussed the hepatitis B vaccine and its benefits. Please consider getting vaccinated, and we can make a decision by your next visit.  INSTRUCTIONS:  Please complete the urine sample collection and await the results of the pending blood tests and urine sample. We will provide you with a new glucometer. Continue with your current medications and lifestyle changes. Consider the hepatitis B vaccination, and we will discuss it further at your next visit.

## 2024-05-11 ENCOUNTER — Other Ambulatory Visit: Payer: Self-pay | Admitting: *Deleted

## 2024-05-11 DIAGNOSIS — R768 Other specified abnormal immunological findings in serum: Secondary | ICD-10-CM | POA: Diagnosis not present

## 2024-05-11 DIAGNOSIS — D472 Monoclonal gammopathy: Secondary | ICD-10-CM

## 2024-05-13 LAB — MULTIPLE MYELOMA PANEL, SERUM
Albumin SerPl Elph-Mcnc: 3.7 g/dL (ref 2.9–4.4)
Albumin/Glob SerPl: 1.2 (ref 0.7–1.7)
Alpha 1: 0.2 g/dL (ref 0.0–0.4)
Alpha2 Glob SerPl Elph-Mcnc: 0.6 g/dL (ref 0.4–1.0)
B-Globulin SerPl Elph-Mcnc: 1.2 g/dL (ref 0.7–1.3)
Gamma Glob SerPl Elph-Mcnc: 1.1 g/dL (ref 0.4–1.8)
Globulin, Total: 3.1 g/dL (ref 2.2–3.9)
IgA: 364 mg/dL (ref 90–386)
IgG (Immunoglobin G), Serum: 1245 mg/dL (ref 603–1613)
IgM (Immunoglobulin M), Srm: 47 mg/dL (ref 20–172)
Total Protein ELP: 6.8 g/dL (ref 6.0–8.5)

## 2024-05-16 LAB — UPEP/UIFE/LIGHT CHAINS/TP, 24-HR UR
% BETA, Urine: 25.7 %
ALPHA 1 URINE: 5.9 %
Albumin, U: 29.4 %
Alpha 2, Urine: 14.6 %
Free Kappa Lt Chains,Ur: 105.01 mg/L — ABNORMAL HIGH (ref 1.17–86.46)
Free Kappa/Lambda Ratio: 5.15 (ref 1.83–14.26)
Free Lambda Lt Chains,Ur: 20.41 mg/L — ABNORMAL HIGH (ref 0.27–15.21)
GAMMA GLOBULIN URINE: 24.4 %
Total Protein, Urine-Ur/day: 158 mg/(24.h) — ABNORMAL HIGH (ref 30–150)
Total Protein, Urine: 11.7 mg/dL
Total Volume: 1350

## 2024-05-17 ENCOUNTER — Encounter (HOSPITAL_COMMUNITY): Payer: Self-pay

## 2024-05-17 ENCOUNTER — Ambulatory Visit (HOSPITAL_COMMUNITY): Payer: MEDICAID | Admitting: Licensed Clinical Social Worker

## 2024-05-18 ENCOUNTER — Ambulatory Visit: Payer: Self-pay | Admitting: *Deleted

## 2024-05-18 NOTE — Telephone Encounter (Signed)
 TCT patient regarding recent lab results. No answer. No vm available. My Chart message sent regarding  his normal lab results.

## 2024-05-18 NOTE — Telephone Encounter (Signed)
-----   Message from Norleen ONEIDA Kidney IV sent at 05/18/2024  8:26 AM EDT ----- Please let Mr. Needs know that his labs showed no concerning protein abnormalities.  We did not find any evidence of an M protein and there is no evidence of underlying bone marrow disorder.  We did  not find any trouble in the blood or urine.  There is no need for routine follow-up in our clinic.  Recommend he continue to follow with his primary care provider. ----- Message ----- From: Rebecka, Lab In [place] Sent: 05/09/2024   1:05 PM EDT To: Norleen ONEIDA Kidney [place], MD

## 2024-06-16 ENCOUNTER — Telehealth: Payer: Self-pay | Admitting: Internal Medicine

## 2024-06-16 ENCOUNTER — Telehealth: Payer: Self-pay

## 2024-06-16 NOTE — Telephone Encounter (Signed)
 Patient has been called and informed that referral was placed in May and he was given the contact information to call and schedule appointment.    Copied from CRM [id number]. Topic: Clinical - Medical Advice >> Jun 16, 2024  9:52 AM Zy'onna H wrote: Reason for CRM:  Patient is looking for Nutritional assistance/service from the clinic to help with his A1c and how to help with his eating habits and how to get better.

## 2024-06-16 NOTE — Telephone Encounter (Signed)
 Patient came to the office requesting a call to review his lab results, as he is concerned. He is also requesting a referral to a hematologist for his plasma. Please advise, and give him a call.

## 2024-06-18 NOTE — Telephone Encounter (Signed)
 Patient already saw the hematologist/oncologist Dr. Federico on 05/09/2024.  He did some blood and urine tests and had his nurse Almarie Arabia try to reach him to go over results.  They also sent results to his Mychart. According to Dr. Federico  Your labs showed no concerning protein abnormalities.  We did not find any evidence of an M protein and there is no evidence of underlying bone marrow disorder.  We did  not find any trouble in the blood or urine.  There is no need for routine follow-up in our clinic.  Recommend he continue to follow with his primary care provider.   Please call if you have any questions. [phone]

## 2024-06-21 NOTE — Telephone Encounter (Signed)
 Called & spoke to the patient. Verified name & DOB. Informed of Dr.Dorsey's results & recommendations. Patient expressed verbal understanding. Patient requested to reschedule appointment with Herlene due to out of country travel. Appointment rescheduled for next available 08/02/2024. Confirmed next appointment with PCP in November. Patient expressed verbal understanding of all discussed. No further assistance needed.

## 2024-06-28 ENCOUNTER — Ambulatory Visit: Payer: MEDICAID | Admitting: Pharmacist

## 2024-08-01 ENCOUNTER — Telehealth: Payer: Self-pay | Admitting: Internal Medicine

## 2024-08-01 NOTE — Telephone Encounter (Signed)
 LVM to confirm appt for 9/30

## 2024-08-02 ENCOUNTER — Ambulatory Visit: Payer: MEDICAID | Admitting: Pharmacist

## 2024-08-17 NOTE — Telephone Encounter (Signed)
 Copied from CRM #[id number]. Topic: Appointments - Scheduling Inquiry for Clinic >> Aug 17, 2024 11:22 AM Jeffery Santos wrote:  Reason for CRM: patient had to miss his 9/30 appt and was wondering if he can get a new appt.

## 2024-08-17 NOTE — Telephone Encounter (Addendum)
 Called patient, Patient scheduled for 08/18/2024 at 9:30 am.

## 2024-08-18 ENCOUNTER — Ambulatory Visit: Payer: MEDICAID | Admitting: Pharmacist

## 2024-08-18 ENCOUNTER — Other Ambulatory Visit (HOSPITAL_COMMUNITY): Payer: Self-pay

## 2024-09-12 ENCOUNTER — Encounter: Payer: Self-pay | Admitting: Internal Medicine

## 2024-09-12 ENCOUNTER — Ambulatory Visit: Payer: MEDICAID | Attending: Internal Medicine | Admitting: Internal Medicine

## 2024-09-12 ENCOUNTER — Ambulatory Visit: Payer: MEDICAID | Admitting: Internal Medicine

## 2024-09-12 DIAGNOSIS — E785 Hyperlipidemia, unspecified: Secondary | ICD-10-CM | POA: Diagnosis not present

## 2024-09-12 DIAGNOSIS — Z7984 Long term (current) use of oral hypoglycemic drugs: Secondary | ICD-10-CM

## 2024-09-12 DIAGNOSIS — E119 Type 2 diabetes mellitus without complications: Secondary | ICD-10-CM

## 2024-09-12 DIAGNOSIS — Z6837 Body mass index (BMI) 37.0-37.9, adult: Secondary | ICD-10-CM

## 2024-09-12 DIAGNOSIS — I10 Essential (primary) hypertension: Secondary | ICD-10-CM

## 2024-09-12 DIAGNOSIS — I152 Hypertension secondary to endocrine disorders: Secondary | ICD-10-CM

## 2024-09-12 DIAGNOSIS — E1169 Type 2 diabetes mellitus with other specified complication: Secondary | ICD-10-CM | POA: Diagnosis not present

## 2024-09-12 DIAGNOSIS — E1159 Type 2 diabetes mellitus with other circulatory complications: Secondary | ICD-10-CM

## 2024-09-12 DIAGNOSIS — B353 Tinea pedis: Secondary | ICD-10-CM

## 2024-09-12 DIAGNOSIS — Z2821 Immunization not carried out because of patient refusal: Secondary | ICD-10-CM

## 2024-09-12 LAB — POCT GLYCOSYLATED HEMOGLOBIN (HGB A1C): HbA1c, POC (controlled diabetic range): 11.4 % — AB (ref 0.0–7.0)

## 2024-09-12 LAB — GLUCOSE, POCT (MANUAL RESULT ENTRY): POC Glucose: 342 mg/dL — AB (ref 70–99)

## 2024-09-12 MED ORDER — KETOCONAZOLE 2 % EX CREA
1.0000 | TOPICAL_CREAM | Freq: Every day | CUTANEOUS | 0 refills | Status: AC
Start: 2024-09-12 — End: ?

## 2024-09-12 MED ORDER — ACCU-CHEK GUIDE W/DEVICE KIT: PACK | 0 refills | Status: AC

## 2024-09-12 MED ORDER — ACCU-CHEK GUIDE TEST VI STRP: ORAL_STRIP | 6 refills | Status: AC

## 2024-09-12 MED ORDER — ACCU-CHEK SOFTCLIX LANCETS MISC
6 refills | Status: AC
Start: 2024-09-12 — End: ?

## 2024-09-12 MED ORDER — RYBELSUS 3 MG PO TABS: 3.0000 mg | ORAL_TABLET | Freq: Every day | ORAL | 4 refills | Status: AC

## 2024-09-12 MED ORDER — ROSUVASTATIN CALCIUM 10 MG PO TABS: 10.0000 mg | ORAL_TABLET | Freq: Every day | ORAL | 3 refills | Status: AC

## 2024-09-12 NOTE — Patient Instructions (Signed)
  VISIT SUMMARY: Today, you had a follow-up visit to discuss your diabetes, hypertension, hyperlipidemia, and a new concern about athlete's foot. We reviewed your current medications, lifestyle habits, and made some changes to your treatment plan to better manage your conditions.  YOUR PLAN: -TYPE 2 DIABETES MELLITUS, UNCONTROLLED: Your diabetes is not well controlled, as indicated by your A1c level of 11.4 and a blood sugar reading of 342 mg/dL. We have stopped metformin  due to its side effects and started you on Rybelsus 3 mg once daily, an oral medication that helps control blood sugar levels. You are also referred for a diabetic eye exam and we have resent the prescription for your diabetic testing supplies. It's important to improve your diet and increase your physical activity.  -HYPERLIPIDEMIA: Hyperlipidemia means you have high levels of fats in your blood, which can increase your risk of heart disease. You need to restart taking rosuvastatin  as prescribed to help manage this condition.  -TINEA PEDIS (ATHLETE'S FOOT): Tinea pedis, commonly known as athlete's foot, is a fungal infection that causes itching and discomfort between your toes. We have prescribed ketoconazole  2% cream to treat the infection and advised you to keep your feet dry after showering.  INSTRUCTIONS: Please follow up for a diabetic eye exam and ensure you obtain your diabetic testing supplies. Restart taking rosuvastatin  as prescribed. Use the ketoconazole  cream as directed and keep your feet dry to help with the athlete's foot. Make sure to improve your diet and increase your physical activity to better manage your diabetes.                      Contains text generated by Abridge.                                 Contains text generated by Abridge.
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
## 2024-09-13 LAB — BASIC METABOLIC PANEL WITH GFR
BUN/Creatinine Ratio: 7 — ABNORMAL LOW (ref 9–20)
BUN: 9 mg/dL (ref 6–24)
CO2: 21 mmol/L (ref 20–29)
Calcium: 9.1 mg/dL (ref 8.7–10.2)
Chloride: 99 mmol/L (ref 96–106)
Creatinine, Ser: 1.26 mg/dL (ref 0.76–1.27)
Glucose: 370 mg/dL — ABNORMAL HIGH (ref 70–99)
Potassium: 4.9 mmol/L (ref 3.5–5.2)
Sodium: 136 mmol/L (ref 134–144)
eGFR: 72 mL/min/1.73 (ref >59)

## 2024-09-14 ENCOUNTER — Ambulatory Visit: Payer: Self-pay | Admitting: Internal Medicine

## 2024-10-10 ENCOUNTER — Encounter: Payer: Self-pay | Admitting: Pharmacist

## 2024-10-10 ENCOUNTER — Ambulatory Visit: Payer: MEDICAID | Attending: Internal Medicine | Admitting: Pharmacist

## 2024-10-10 ENCOUNTER — Other Ambulatory Visit: Payer: Self-pay

## 2024-10-10 DIAGNOSIS — E1165 Type 2 diabetes mellitus with hyperglycemia: Secondary | ICD-10-CM | POA: Diagnosis not present

## 2024-10-10 DIAGNOSIS — Z7984 Long term (current) use of oral hypoglycemic drugs: Secondary | ICD-10-CM

## 2024-10-10 MED ORDER — PEN NEEDLES 32G X 4 MM MISC
1 refills | Status: AC
  Filled 2024-10-10: qty 100, 100d supply, fill #0

## 2024-10-10 MED ORDER — LANTUS SOLOSTAR 100 UNIT/ML ~~LOC~~ SOPN
12.0000 [IU] | PEN_INJECTOR | Freq: Every day | SUBCUTANEOUS | 3 refills | Status: DC
  Filled 2024-10-10: qty 6, 50d supply, fill #0
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
## 2024-11-14 ENCOUNTER — Ambulatory Visit: Payer: MEDICAID | Admitting: Pharmacist

## 2024-11-25 ENCOUNTER — Other Ambulatory Visit: Payer: Self-pay | Admitting: Internal Medicine

## 2024-11-25 ENCOUNTER — Ambulatory Visit: Payer: Self-pay | Admitting: Internal Medicine

## 2024-11-25 DIAGNOSIS — E1169 Type 2 diabetes mellitus with other specified complication: Secondary | ICD-10-CM

## 2024-11-25 MED ORDER — LANTUS SOLOSTAR 100 UNIT/ML ~~LOC~~ SOPN: 12.0000 [IU] | PEN_INJECTOR | Freq: Every day | SUBCUTANEOUS | 3 refills | Status: AC

## 2024-11-25 NOTE — Telephone Encounter (Signed)
 Called pharmacy - pt has refills of test strips available. Will refill.

## 2024-11-25 NOTE — Telephone Encounter (Signed)
 FYI Only or Action Required?: Action required by provider: medication refill request.  Patient was last seen in primary care on 09/12/2024 by Vicci Barnie NOVAK, MD.  Called Nurse Triage reporting Blood Sugar Problem.  Symptoms began today.  Interventions attempted: Nothing.  Symptoms are: unchanged.  Triage Disposition: Call PCP Now  Patient/caregiver understands and will follow disposition?: Yes

## 2024-11-25 NOTE — Telephone Encounter (Signed)
 Copied from CRM #[id number]. Topic: Clinical - Medication Refill >> Nov 25, 2024 10:16 AM Zebedee SAUNDERS wrote: Medication: insulin  glargine (LANTUS  SOLOSTAR) 100 UNIT/ML Solostar Pen, glucose blood (ACCU-CHEK GUIDE TEST) test strip  Has the patient contacted their pharmacy? Yes (Agent: If no, request that the patient contact the pharmacy for the refill. If patient does not wish to contact the pharmacy document the reason why and proceed with request.) (Agent: If yes, when and what did the pharmacy advise?)  This is the patient's preferred pharmacy:   [id number] [place], [place] - [address] [place] [address] Phone: [phone] Fax: [phone]  Is this the correct pharmacy for this prescription? Yes If no, delete pharmacy and type the correct one.   Has the prescription been filled recently? Yes  Is the patient out of the medication? Yes  Has the patient been seen for an appointment in the last year OR does the patient have an upcoming appointment? Yes  Can we respond through MyChart? Yes  Agent: Please be advised that Rx refills may take up to 3 business days. We ask that you follow-up with your pharmacy.

## 2024-11-25 NOTE — Telephone Encounter (Signed)
 Requested Prescriptions  Pending Prescriptions Disp Refills   insulin  glargine (LANTUS  SOLOSTAR) 100 UNIT/ML Solostar Pen 6 mL 3    Sig: Inject 12 Units into the skin daily. Increase by 3 units once every 3 days if your fasting sugar is above 130. Do not go past 21 units.     Endocrinology:  Diabetes - Insulins Failed - 11/25/2024  1:31 PM      Failed - HBA1C is between 0 and 7.9 and within 180 days    HbA1c, POC (controlled diabetic range)  Date Value Ref Range Status  09/12/2024 11.4 (A) 0.0 - 7.0 % Final         Passed - Valid encounter within last 6 months    Recent Outpatient Visits           1 month ago Type 2 diabetes mellitus with hyperglycemia, without long-term current use of insulin  (HCC)   [facility] [place]. [facility] Fleeta Morris, [place] L, RPH-CPP   2 months ago Type 2 diabetes mellitus associated with morbid obesity (HCC)   [facility] - A Dept Of [place]. [facility] Vicci Sober B, MD   6 months ago Type 2 diabetes mellitus with obesity   [facility] - A Dept Of [place]. [facility] Vicci Sober B, MD   7 months ago Type 2 diabetes mellitus with obesity   [facility] - A Dept Of [place]. [facility] Fleeta Morris, [place] L, RPH-CPP   8 months ago Type 2 diabetes mellitus with obesity    [facility] Dept Of [place]. [facility] Vicci Sober NOVAK, MD              Refused Prescriptions Disp Refills   glucose blood (ACCU-CHEK GUIDE TEST) test strip 100 each 6    Sig: Use to check blood sugar 2 times daily.     There is no refill protocol information for this order

## 2024-11-25 NOTE — Telephone Encounter (Signed)
" ° °  Summary: Insulin  430   Reason for Triage: Pt's insulin  is 430 has been without medication    Reason for Disposition  Blood glucose > 400 mg/dL (77.7 mmol/L)  Answer Assessment - Initial Assessment Questions PT states blood sugar was 430 today after eating lunch. He tried to eat healthier today. Had tuna, avocado, one may packet and one sweet relish packet and some wheat crackers. He has increased his water intake and is trying to walk more. He states he has been out of his insulin  for a few days. RN did advise pt in the future to try to get a hold of office prior to running out. He states he got up to 18 units of lantus  but felt like 14 units worked the best for him, more regulated. Pt does state he has a headache. RN did advise pt if bs gets over 500, he starts vomiting, having rapid breathing or any other symptoms to go to ER. Pt stated understanding.  CAL notified pt is out of mediations.     1. BLOOD GLUCOSE: What is your blood glucose level?      430 2. ONSET: When did you check the blood glucose?     A little bit ago 3. USUAL RANGE: What is your glucose level usually? (e.g., usual fasting morning value, usual evening value)     Pt stated he only checked his BS today because he had a headache 4. KETONES: Do you check for ketones (urine or blood test strips)? If Yes, ask: What does the test show now?       5. TYPE 1 or 2:  Do you know what type of diabetes you have?  (e.g., Type 1, Type 2, Gestational; doesn't know)      Type 2 6. INSULIN : Do you take insulin ? What type of insulin (s) do you use? What is the mode of delivery? (syringe, pen; injection or pump)?      Lantus  14 units works best for him 7. DIABETES PILLS: Do you take any pills for your diabetes? If Yes, ask: Have you missed taking any pills recently?     no 8. OTHER SYMPTOMS: Do you have any symptoms? (e.g., fever, frequent urination, difficulty breathing, dizziness, weakness, vomiting)      Headache, denies other symptoms  Protocols used: Diabetes - High Blood Sugar-A-AH  "

## 2024-11-25 NOTE — Telephone Encounter (Signed)
 Patient has been informed.

## 2025-01-10 ENCOUNTER — Ambulatory Visit: Payer: MEDICAID | Admitting: Internal Medicine
